# Patient Record
Sex: Male | Born: 1935 | Race: White | Hispanic: No | Marital: Married | Smoking: Never smoker
Health system: Southern US, Community
[De-identification: ages and names within clinical notes are randomized; demographics above are authoritative.]

## PROBLEM LIST (undated history)

## (undated) DIAGNOSIS — N400 Enlarged prostate without lower urinary tract symptoms: Secondary | ICD-10-CM

## (undated) DIAGNOSIS — M1990 Unspecified osteoarthritis, unspecified site: Secondary | ICD-10-CM

## (undated) DIAGNOSIS — R339 Retention of urine, unspecified: Secondary | ICD-10-CM

## (undated) DIAGNOSIS — R319 Hematuria, unspecified: Secondary | ICD-10-CM

## (undated) DIAGNOSIS — Z860101 Personal history of adenomatous and serrated colon polyps: Secondary | ICD-10-CM

## (undated) DIAGNOSIS — Z960 Presence of urogenital implants: Secondary | ICD-10-CM

## (undated) DIAGNOSIS — K5730 Diverticulosis of large intestine without perforation or abscess without bleeding: Secondary | ICD-10-CM

## (undated) DIAGNOSIS — Z978 Presence of other specified devices: Secondary | ICD-10-CM

## (undated) DIAGNOSIS — Z973 Presence of spectacles and contact lenses: Secondary | ICD-10-CM

## (undated) DIAGNOSIS — Z86010 Personal history of colonic polyps: Secondary | ICD-10-CM

## (undated) HISTORY — PX: TONSILLECTOMY AND ADENOIDECTOMY: SUR1326

## (undated) HISTORY — PX: COLONOSCOPY WITH PROPOFOL: SHX5780

---

## 2002-06-16 ENCOUNTER — Observation Stay (HOSPITAL_COMMUNITY): Admission: RE | Admit: 2002-06-16 | Discharge: 2002-06-17 | Payer: Self-pay | Admitting: Orthopedic Surgery

## 2002-06-16 ENCOUNTER — Encounter: Payer: Self-pay | Admitting: Orthopedic Surgery

## 2002-06-16 HISTORY — PX: SHOULDER ARTHROSCOPY WITH SUBACROMIAL DECOMPRESSION: SHX5684

## 2003-08-03 ENCOUNTER — Encounter: Payer: Self-pay | Admitting: Internal Medicine

## 2006-09-09 ENCOUNTER — Ambulatory Visit: Payer: Self-pay | Admitting: Internal Medicine

## 2006-09-12 ENCOUNTER — Encounter: Admission: RE | Admit: 2006-09-12 | Discharge: 2006-09-12 | Payer: Self-pay | Admitting: Orthopedic Surgery

## 2006-09-24 ENCOUNTER — Ambulatory Visit: Payer: Self-pay | Admitting: Internal Medicine

## 2006-11-05 ENCOUNTER — Ambulatory Visit (HOSPITAL_COMMUNITY): Admission: RE | Admit: 2006-11-05 | Discharge: 2006-11-06 | Payer: Self-pay | Admitting: Orthopedic Surgery

## 2006-11-05 HISTORY — PX: SHOULDER ARTHROSCOPY W/ SUBACROMIAL DECOMPRESSION AND DISTAL CLAVICLE EXCISION: SHX2401

## 2007-02-04 ENCOUNTER — Encounter: Admission: RE | Admit: 2007-02-04 | Discharge: 2007-02-04 | Payer: Self-pay | Admitting: Orthopedic Surgery

## 2010-06-12 ENCOUNTER — Encounter (INDEPENDENT_AMBULATORY_CARE_PROVIDER_SITE_OTHER): Payer: Self-pay | Admitting: *Deleted

## 2010-06-12 ENCOUNTER — Telehealth: Payer: Self-pay | Admitting: Internal Medicine

## 2010-06-24 ENCOUNTER — Ambulatory Visit: Payer: Self-pay | Admitting: Internal Medicine

## 2010-06-24 DIAGNOSIS — K625 Hemorrhage of anus and rectum: Secondary | ICD-10-CM

## 2010-06-24 DIAGNOSIS — Z860100 Personal history of colon polyps, unspecified: Secondary | ICD-10-CM | POA: Insufficient documentation

## 2010-06-25 ENCOUNTER — Ambulatory Visit: Payer: Self-pay | Admitting: Internal Medicine

## 2010-06-28 ENCOUNTER — Encounter: Payer: Self-pay | Admitting: Internal Medicine

## 2010-09-24 NOTE — Letter (Signed)
Summary: New Patient letter  [facility]  [address] [place], [place] [address]   Phone: [phone]  Fax: [phone]       06/12/2010 MRN: [id number]  [facility] [address] [place], [place]  [address]  Dear Cody Hudson,  Welcome to the Gastroenterology Division at [facility].    You are scheduled to see Dr.  Marina Goodell  on10/31/11 at 2:45 pm on the 3rd floor at [facility], [address]  We ask that you try to arrive at our office 15 minutes prior to your appointment time to allow for check-in.  We would like you to complete the enclosed self-administered evaluation form prior to your visit and bring it with you on the day of your appointment.  We will review it with you.  Also, please bring a complete list of all your medications or, if you prefer, bring the medication bottles and we will list them.  Please bring your insurance card so that we may make a copy of it.  If your insurance requires a referral to see a specialist, please bring your referral form from your primary care physician.  Co-payments are due at the time of your visit and may be paid by cash, check or credit card.     Your office visit will consist of a consult with your physician (includes a physical exam), any laboratory testing he/she may order, scheduling of any necessary diagnostic testing (e.g. x-ray, ultrasound, CT-scan), and scheduling of a procedure (e.g. Endoscopy, Colonoscopy) if required.  Please allow enough time on your schedule to allow for any/all of these possibilities.    If you cannot keep your appointment, please call [phone] to cancel or reschedule prior to your appointment date.  This allows [place] the opportunity to schedule an appointment for another patient in need of care.  If you do not cancel or reschedule by 5 p.m. the business day prior to your appointment date, you will be charged a $50.00 late cancellation/no-show fee.    Thank you for choosing  [facility] for your medical needs.  We appreciate the opportunity to care for you.  Please visit [place] at our website  to learn more about our practice.                     Sincerely,                                                             The Gastroenterology Division

## 2010-09-24 NOTE — Procedures (Signed)
Summary: Colonoscopy   Colonoscopy  Procedure date:  08/03/2003  Findings:      Location:  [facility].  Results: Diverticulosis.       Pathology:  Adenomatous polyp.          Procedures Next Due Date:    Colonoscopy: 07/2006  Colonoscopy  Procedure date:  08/03/2003  Findings:      Location:  [facility].  Results: Diverticulosis.       Pathology:  Adenomatous polyp.          Procedures Next Due Date:    Colonoscopy: 07/2006 Patient Name: Cody Hudson, Cody Hudson. MRN:  Procedure Procedures: Colonoscopy CPT: [id number].    with polypectomy. CPT: [id number].  Personnel: Endoscopist: Wilhemina Bonito. Marina Goodell, MD.  Referred By: Pearletha Furl Jacky Kindle, MD.  Exam Location: Exam performed in Outpatient Clinic. Outpatient  Patient Consent: Procedure, Alternatives, Risks and Benefits discussed, consent obtained, from patient. Consent was obtained by the RN.  Indications  Average Risk Screening Routine.  History  Current Medications: Patient is not currently taking Coumadin.  Pre-Exam Physical: Performed Aug 03, 2003. Entire physical exam was normal.  Exam Exam: Extent of exam reached: Cecum, extent intended: Cecum.  The cecum was identified by appendiceal orifice and IC valve. Patient position: on left side. Colon retroflexion performed. Images taken. ASA Classification: II. Tolerance: excellent.  Monitoring: Pulse and BP monitoring, Oximetry used. Supplemental O2 given.  Colon Prep Used Golytely for colon prep. Prep results: excellent.  Sedation Meds: Patient assessed and found to be appropriate for moderate (conscious) sedation. Fentanyl 100 mcg. given IV. Versed 10 mg. given IV.  Findings NORMAL EXAM: Cecum to Rectum. Comments: INTERNAL HEMORRHOIDS PRESENT.  POLYP: Cecum, diminutive, Procedure:  snare without cautery, removed, retrieved, Polyp sent to pathology. ICD9: Colon Polyps: 211.3.  - DIVERTICULOSIS: Ascending Colon to Sigmoid Colon.  ICD9: Diverticulosis, Colon: 562.10. Comments: MARKED CHANGES IN LEFT COLON.   Assessment Abnormal examination, see findings above.  Diagnoses: 211.3: Colon Polyps.  562.10: Diverticulosis, Colon.  455.0: Hemorrhoids, Internal.   Events  Unplanned Interventions: No intervention was required.  Unplanned Events: There were no complications. Plans Disposition: After procedure patient sent to recovery. After recovery patient sent home.  Scheduling/Referral: Colonoscopy, to Wilhemina Bonito. Marina Goodell, MD, IN 3 YEARS IF POLYP ADENOMATOUS; OTHERWISE 5 YEARS,    This report was created from the original endoscopy report, which was reviewed and signed by the above listed endoscopist.   cc:  Geoffry Paradise, MD      The Patient

## 2010-09-24 NOTE — Procedures (Signed)
Summary: Colonoscopy   Colonoscopy  Procedure date:  09/24/2006  Findings:      Location:  [facility].  Results: Hemorrhoids.     Results: Diverticulosis.         Procedures Next Due Date:    Colonoscopy: 09/2011  Colonoscopy  Procedure date:  09/24/2006  Findings:      Location:  [facility].  Results: Hemorrhoids.     Results: Diverticulosis.         Procedures Next Due Date:    Colonoscopy: 09/2011 Patient Name: Fumio, Vandam. MRN:  Procedure Procedures: Colonoscopy CPT: [phone].  Personnel: Endoscopist: Wilhemina Bonito. Marina Goodell, MD.  Exam Location: Exam performed in Outpatient Clinic. Outpatient  Patient Consent: Procedure, Alternatives, Risks and Benefits discussed, consent obtained, from patient. Consent was obtained by the RN.  Indications  Surveillance of: Adenomatous Polyp(s). This is an initial surveillance exam. Initial polypectomy was performed in 2004. in Dec. 1-2 Polyps were found at Index Exam. Largest polyp removed was 1 to 5 mm. Prior polyp located in distal colon. Pathology of worst  polyp: tubular adenoma.  History  Current Medications: Patient is not currently taking Coumadin.  Pre-Exam Physical: Performed Sep 24, 2006. Cardio-pulmonary exam, Rectal exam, HEENT exam , Abdominal exam, Mental status exam WNL.  Comments: Pt. history reviewed/updated, physical exam performed prior to initiation of sedation?yes Exam Exam: Extent of exam reached: Cecum, extent intended: Cecum.  The cecum was identified by appendiceal orifice and IC valve. Patient position: on left side. Time to Cecum: 00:04:23. Time for Withdrawl: 00:10:55. Colon retroflexion performed. Images taken. ASA Classification: II. Tolerance: excellent.  Monitoring: Pulse and BP monitoring, Oximetry used. Supplemental O2 given.  Colon Prep Used Miralax for colon prep. Prep results: excellent.  Sedation Meds: Patient assessed and found to be appropriate for  moderate (conscious) sedation. Fentanyl 75 mcg. given IV. Versed 8 mg. given IV.  Findings NORMAL EXAM: Cecum to Rectum.  - DIVERTICULOSIS: Descending Colon to Sigmoid Colon. ICD9: Diverticulosis, Colon: 562.10. Comments: moderate changes / deep folds.   Assessment  Diagnoses: 562.10: Diverticulosis, Colon.  455.0: Hemorrhoids, Internal.   Comments: NO POLYPS SEEN Events  Unplanned Interventions: No intervention was required.  Unplanned Events: There were no complications. Plans Disposition: After procedure patient sent to recovery. After recovery patient sent home.  Scheduling/Referral: Colonoscopy, to Wilhemina Bonito. Marina Goodell, MD, in 5 years,    This report was created from the original endoscopy report, which was reviewed and signed by the above listed endoscopist.   cc:  Maretta Bees      The Patient

## 2010-09-24 NOTE — Progress Notes (Signed)
Summary: triage  Phone Note From Other Clinic Call back at [phone]   Caller: Malachi Bonds, scheduler Call For: Dr. Marina Goodell Reason for Call: Schedule Patient Appt Summary of Call: Dr. Jacky Kindle would like pt seen for rectal bleeding... Malachi Bonds said next available is not soon enough... asked that pt isd called directly at [phone] Initial call taken by: Vallarie Mare,  June 12, 2010 2:40 PM  Follow-up for Phone Call        pt scheduled for 06/24/10 Malachi Bonds to send records Follow-up by: Chales Abrahams CMA Duncan Dull),  June 12, 2010 3:01 PM

## 2010-09-24 NOTE — Procedures (Signed)
Summary: Colonoscopy  Patient: Cody Hudson Note: All result statuses are Final unless otherwise noted.  Tests: (1) Colonoscopy (COL)   COL Colonoscopy           DONE     [facility]     [address]     [place], [place]  [address]           COLONOSCOPY PROCEDURE REPORT           PATIENT:  [name], [name]  MR#:  [id number]     BIRTHDATE:  [birth year], 76 yrs. old  GENDER:  male     ENDOSCOPIST:  Wilhemina Bonito. Eda Keys, MD     REF. BY:  Office     PROCEDURE DATE:  06/25/2010     PROCEDURE:  Colonoscopy with snare polypectomy X 1     ASA CLASS:  Class II     INDICATIONS:  history of pre-cancerous (adenomatous) colon polyps,     surveillance and high-risk screening, rectal bleeding ; INDEX EXAM     07-2003 W/ TA; F/U 08-2006     MEDICATIONS:   Fentanyl 75 mcg IV, Versed 7 mg IV           DESCRIPTION OF PROCEDURE:   After the risks benefits and     alternatives of the procedure were thoroughly explained, informed     consent was obtained.  Digital rectal exam was performed and     revealed no abnormalities.   The LB 180AL [id number] endoscope was     introduced through the anus and advanced to the cecum, which was     identified by both the appendix and ileocecal valve, without     limitations.TIME TO CECUM = 3:28 MIN. The quality of the prep was     excellent, using MoviPrep.  The instrument was then slowly     withdrawn (TIME = 10:37 MIN) as the colon was fully examined.     <<PROCEDUREIMAGES>>           FINDINGS:  A diminutive polyp was found in the ascending colon.     Polyp was snared without cautery. Retrieval was successful.     Moderate diverticulosis was found in the left colon.   Retroflexed     views in the rectum revealed internal hemorrhoids.    The scope     was then withdrawn from the patient and the procedure completed.           COMPLICATIONS:  None     ENDOSCOPIC IMPRESSION:     1) Diminutive polyp in the ascending colon - REMOVED     2) Moderate  diverticulosis in the left colon     3) Internal hemorrhoids - CAUSE FOR RECENT BLEEDING           RECOMMENDATIONS:     1) Follow up colonoscopy in 5 years           ______________________________     Wilhemina Bonito. Eda Keys, MD           CC:  Geoffry Paradise, MD; The Patient           n.     eSIGNED:   Wilhemina Bonito. Eda Keys at 06/25/2010 02:59 PM           Loistine Simas, [id number]  Note: An exclamation mark (!) indicates a result that was not dispersed into the flowsheet. Document Creation Date: 06/25/2010 2:59 PM _______________________________________________________________________  (1) Order result status: Final Collection  or observation date-time: 06/25/2010 14:53 Requested date-time:  Receipt date-time:  Reported date-time:  Referring Physician:   Ordering Physician: Fransico Setters [phone]) Specimen Source:  Source: Launa Grill Order Number: [id number] Lab site:   Appended Document: Colonoscopy     Procedures Next Due Date:    Colonoscopy: 06/2015

## 2010-09-24 NOTE — Letter (Signed)
Summary: Patient Notice- Polyp Results  [facility]  [address] [place], [place] [address]   Phone: [phone]  Fax: [phone]        June 28, 2010 MRN: [id number]    [facility] [address] [place], [place]  [address]    Dear Cody Hudson,  I am pleased to inform you that the colon polyp(s) removed during your recent colonoscopy was (were) found to be benign (no cancer detected) upon pathologic examination.  I recommend you have a repeat colonoscopy examination in 5 years to look for recurrent polyps, as having colon polyps increases your risk for having recurrent polyps or even colon cancer in the future.  Should you develop new or worsening symptoms of abdominal pain, bowel habit changes or bleeding from the rectum or bowels, please schedule an evaluation with either your primary care physician or with me.  Additional information/recommendations:  __ No further action with gastroenterology is needed at this time. Please      follow-up with your primary care physician for your other healthcare      needs.    Please call us if you are having persistent problems or have questions about your condition that have not been fully answered at this time.  Sincerely,  Hilarie Fredrickson MD  This letter has been electronically signed by your physician.  Appended Document: Patient Notice- Polyp Results letter mailed

## 2010-09-24 NOTE — Letter (Signed)
Summary: [facility] Instructions  [facility]  [address] [place], [place] [address]   Phone: [phone]  Fax: [phone]       ESTEVON FLUKE    Jul 24, 1948    MRN: [id number]        Procedure Day /Date:TUESDAY, 06/25/10     Arrival Time:1:00 PM     Procedure Time:2:00 PM     Location of Procedure:                    X  [facility] (4th Floor)  PREPARATION FOR COLONOSCOPY WITH MOVIPREP   Starting TODAY no solid foods.  THE DAY BEFORE YOUR PROCEDURE         DATE:06/24/10 DAY: today  1.  Drink clear liquids the entire day-NO SOLID FOOD  2.  Do not drink anything colored red or purple.  Avoid juices with pulp.  No orange juice.  3.  Drink at least 64 oz. (8 glasses) of fluid/clear liquids during the day to prevent dehydration and help the prep work efficiently.  CLEAR LIQUIDS INCLUDE: Water Jello Ice Popsicles Tea (sugar ok, no milk/cream) Powdered fruit flavored drinks Coffee (sugar ok, no milk/cream) Gatorade Juice: apple, white grape, white cranberry  Lemonade Clear bullion, consomm, broth Carbonated beverages (any kind) Strained chicken noodle soup Hard Candy                             4.  In the morning, mix first dose of MoviPrep solution:    Empty 1 Pouch A and 1 Pouch B into the disposable container    Add lukewarm drinking water to the top line of the container. Mix to dissolve    Refrigerate (mixed solution should be used within 24 hrs)  5.  Begin drinking the prep at 5:00 p.m. The MoviPrep container is divided by 4 marks.   Every 15 minutes drink the solution down to the next mark (approximately 8 oz) until the full liter is complete.   6.  Follow completed prep with 16 oz of clear liquid of your choice (Nothing red or purple).  Continue to drink clear liquids until bedtime.  7.  Before going to bed, mix second dose of MoviPrep solution:    Empty 1 Pouch A and 1 Pouch B into the disposable container    Add  lukewarm drinking water to the top line of the container. Mix to dissolve    Refrigerate  THE DAY OF YOUR PROCEDURE      DATE: 06/25/10 DAY: TUESDAY  Beginning at 9:00 a.m. (5 hours before procedure):         1. Every 15 minutes, drink the solution down to the next mark (approx 8 oz) until the full liter is complete.  2. Follow completed prep with 16 oz. of clear liquid of your choice.    3. You may drink clear liquids until 12 NOON (2 HOURS BEFORE PROCEDURE).   MEDICATION INSTRUCTIONS  Unless otherwise instructed, you should take regular prescription medications with a small sip of water   as early as possible the morning of your procedure.         OTHER INSTRUCTIONS  You will need a responsible adult at least 75 years of age to accompany you and drive you home.   This person must remain in the waiting room during your procedure.  Wear loose fitting clothing that is easily removed.  Leave jewelry and  other valuables at home.  However, you may wish to bring a book to read or  an iPod/MP3 player to listen to music as you wait for your procedure to start.  Remove all body piercing jewelry and leave at home.  Total time from sign-in until discharge is approximately 2-3 hours.  You should go home directly after your procedure and rest.  You can resume normal activities the  day after your procedure.  The day of your procedure you should not:   Drive   Make legal decisions   Operate machinery   Drink alcohol   Return to work  You will receive specific instructions about eating, activities and medications before you leave.    The above instructions have been reviewed and explained to me by   _______________________    I fully understand and can verbalize these instructions _____________________________ Date _________

## 2010-09-24 NOTE — Assessment & Plan Note (Signed)
Summary: Rectal bleeding (personal history adenoma, family history CRC)   History of Present Illness Visit Type: Initial Consult Primary GI MD: Yancey Flemings MD Primary Provider: Geoffry Paradise, MD Requesting Provider: Geoffry Paradise, MD Chief Complaint: Rectal bleeding 3 days, large amount of blood past plus clots, family hx of colon cancer History of Present Illness:   77 year old with a history of adenomatous polyps and diverticulosis who presents today regarding rectal bleeding. Patient describes some problems with rectal bleeding about 2 weeks ago. He describes some blood associated with the stool 2 consecutive days. Following evening he noticed some blood on the floor with clots. No bleeding since. He denies abdominal pain, or weight loss. He did have a transient change in bowel habits with constipation preceding this episode. He was using laxatives. Now describes his bowels is back to normal or regular. His index colonoscopy in 2004 revealed adenomatous polyps. Followup in January 2008 was negative for recurrent neoplasia. Routine followup in 5 years recommended. No other issues or complaints.   GI Review of Systems    Reports abdominal pain.      Denies acid reflux, belching, bloating, chest pain, dysphagia with liquids, dysphagia with solids, heartburn, loss of appetite, nausea, vomiting, vomiting blood, weight loss, and  weight gain.      Reports change in bowel habits, constipation, hemorrhoids, rectal bleeding, and  rectal pain.     Denies anal fissure, black tarry stools, diarrhea, diverticulosis, fecal incontinence, heme positive stool, irritable bowel syndrome, jaundice, light color stool, and  liver problems. Preventive Screening-Counseling & Management  Alcohol-Tobacco     Smoking Status: never      Drug Use:  no.      Current Medications (verified): 1)  Doxazosin Mesylate 4 Mg Tabs (Doxazosin Mesylate) .... Take 1 Tablet By Mouth Two Times A Day  Allergies  (verified): No Known Drug Allergies  Past History:  Past Medical History: Reviewed history from 06/21/2010 and no changes required. Adenomatous Colon Polyps Diverticulosis Hemorrhoids Family Hx. of Colon Cancer  Past Surgical History: Reviewed history from 06/21/2010 and no changes required. Tonsillectomy Shoulder Surgery  Family History: Family History of Colon Cancer:Father  Social History: Occupation: Retired Patient has never smoked.  Alcohol Use - yes Illicit Drug Use - no Smoking Status:  never Drug Use:  no  Review of Systems       The patient complains of sleeping problems.  The patient denies allergy/sinus, anemia, anxiety-new, arthritis/joint pain, back pain, blood in urine, breast changes/lumps, change in vision, confusion, cough, coughing up blood, depression-new, fainting, fatigue, fever, headaches-new, hearing problems, heart murmur, heart rhythm changes, itching, menstrual pain, muscle pains/cramps, night sweats, nosebleeds, pregnancy symptoms, shortness of breath, skin rash, sore throat, swelling of feet/legs, swollen lymph glands, thirst - excessive , urination - excessive , urination changes/pain, urine leakage, vision changes, and voice change.    Vital Signs:  Patient profile:   77 year old male Height:      75 inches Weight:      226.25 pounds BMI:     28.38 Pulse rate:   72 / minute Pulse rhythm:   regular BP sitting:   114 / 70  (left arm) Cuff size:   regular  Vitals Entered By: June McMurray CMA Duncan Dull) (June 24, 2010 2:50 PM)  Physical Exam  General:  Well developed, well nourished, no acute distress. Head:  Normocephalic and atraumatic. Eyes:  PERRLA, no icterus. Nose:  No deformity, discharge,  or lesions. Mouth:  No deformity or lesions.  Neck:  Supple; no masses or thyromegaly. Lungs:  Clear throughout to auscultation. Heart:  Regular rate and rhythm; no murmurs, rubs,  or bruits. Abdomen:  Soft, nontender and nondistended. No  masses, hepatosplenomegaly or hernias noted. Normal bowel sounds. Rectal:  deferred until colonoscopy Msk:  Symmetrical with no gross deformities. Normal posture. Pulses:  Normal pulses noted. Extremities:  No clubbing, cyanosis, edema or deformities noted. Neurologic:  Alert and  oriented x4. Skin:  Intact without significant lesions or rashes. Psych:  Alert and cooperative. Normal mood and affect.   Impression & Recommendations:  Problem # 1:  RECTAL BLEEDING (ICD-569.3) recent problems with rectal bleeding as described. This after some problems with constipation. Suspect secondary to internal hemorrhoids. Does not sound like diverticular bleed. Rule out recurrent neoplasia.  Plan: #1. Colonoscopy. The nature of the procedure as well as the risks, benefits, and alternatives were reviewed. He understood and agreed to proceed. #2. Movi prep prescribed. The patient instructed on its use  Problem # 2:  COLONIC POLYPS, HX OF (ICD-V12.72) history of adenomatous polyps in 2004. Negative for polyps January 2008. We will move his surveillance examination to this time.  Problem # 3:  FAMILY HX COLON CANCER (ICD-V16.0) in his father  Other Orders: Colonoscopy (Colon)  Patient Instructions: 1)  Colonoscopy [facility] 06/25/10 2:00 pm arrive at 1:00 pm 2)  Movi prep instructions given to patient. 3)  Movi prep Rx. sent to pharmacy. 4)  Copy sent to : Geoffry Paradise, MD 5)  Colonoscopy and Flexible Sigmoidoscopy brochure given.  6)  The medication list was reviewed and reconciled.  All changed / newly prescribed medications were explained.  A complete medication list was provided to the patient / caregiver.

## 2011-01-10 NOTE — Consult Note (Signed)
NAME:  Cody Hudson, Cody Hudson                         ACCOUNT NO.:  [id number]   MEDICAL RECORD NO.:  [id number]                   PATIENT TYPE:  OBV   LOCATION:  0467                                 FACILITY:  [facility]   PHYSICIAN:  Mark C. Vernie Ammons, M.D.               DATE OF BIRTH:  01/04/1934   DATE OF CONSULTATION:  06/17/2002  DATE OF DISCHARGE:                                   CONSULTATION   HISTORY:  The patient is a 77 year old white male who came in for an  elective right shoulder arthroscopy.  He had some mild obstructive symptoms  prior to his admission for about two years consisting of some urgency,  nocturia x2, decreased force of stream, and some intermittency that has been  occurring for about the past three months.  He denied any hesitancy.  He has  never had any gross hematuria, never had history of kidney stones, prior  UTI, prostatitis, or other urologic complaint.  After having his shoulder  surgery, he became full, had strong urge but was unable to void and was in-  and-out catheter 400 cc.  After a second failed attempt at voiding and  draining 500 cc, the Foley catheter was left in.  His urine has been bloody  since the second catheterization.  He currently has a Foley catheter  draining his bladder and had been placed on some Cardura by Dr. Jacky Kindle for  some mild obstructive symptoms a couple of months ago.   PAST MEDICAL HISTORY:  Positive for some left shoulder problems.  He denies  hypertension.  He is otherwise in good health.   PAST SURGICAL HISTORY:  He has had a tonsillectomy in the past.   MEDICATIONS ON ADMISSION:  1. Hydrocodone as needed as night.  2. Doxazosin, dosage is unknown.   ALLERGIES:  NKDA.   SOCIAL HISTORY:  No tobacco, occasional wine use.   FAMILY HISTORY:  Negative for GU malignancy or renal disease.   REVIEW OF SYSTEMS:  Urologically as noted above.  Otherwise per section 2 of  the health history in his chart which is reviewed and  signed.   PHYSICAL EXAMINATION:  GENERAL:  The patient is a well-developed, well-  nourished white male.  In no apparent distress.  VITAL SIGNS:  Temperature 98.2, blood pressure 110/64, respirations 18,  pulse 54.  His weight is 205.  ABDOMEN:  Soft and nontender without mass or HSM.  He had no inguinal  hernias or adenopathy.  GENITOURINARY:  He has a normal circumcised phallus without lesions or  discharge with a Foley catheter indwelling draining blood-tinged urine with  no clots.  His scrotum is normal, testicles are descended and equal in size  and consistency without tenderness or nodularity, no induration.  Epididymides are normal, penis normal.  RECTAL:  Anus and perineum with normal rectal tone.  His prostate is smooth  and symmetric, 1+ enlarged.  He has no suspicious nodularity or induration.  No seminal vesicle abnormality present.   LABORATORY DATA:  BUN 20, creatinine 1.2.    IMPRESSION:  1. Benign prostatic hypertrophy by exam.  2. Bladder outlet obstruction.  This is noted prior to his hospitalization.     I think it has contributed to his acute urinary retention.  3. Acute urinary retention after surgery likely due to the fact that he had     prior outlet obstructive symptoms combined with pain medication and the     lingering effects of the anesthesia.  This should resolve with time.  I     am going to continue Foley catheter drainage for a little while     especially until his urine clears.  4. Gross hematuria.  He has never had this previously, it only occurred     after his Foley catheter was placed.  Once his catheter is out, I will     recheck his urine to make sure it clears but I do not think hematuria     workup is indicated at this point.   PLAN:  1. He is going to continue his Cardura and I have added to that Flomax 0.4     mg two pills q.h.s.  2. His catheter will remain indwelling for two more days until Monday     morning at which time he will  take it out at home if his urine is clear.  3. He will contact me if there is any difficulty in the interim.  Otherwise,     I will plan to see him in the office for a recheck in one week.                                               Mark C. Vernie Ammons, M.D.    MCO/MEDQ  D:  06/17/2002  T:  06/17/2002  Job:  [id number]   cc:   Marlowe Kays, MD  [address]  [place]  [place] [address]  Fax: [phone]

## 2011-01-10 NOTE — Op Note (Signed)
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   OPERATION:  1. Right shoulder arthroscopy (normal examination).  2. Arthroscopic subacromial decompression.   SURGEON:  Marlowe Kays, MD   ASSISTANT:  Clarene Reamer, P.A.-C.   ANESTHESIA:  General.   PATHOLOGY AND JUSTIFICATION FOR PROCEDURE:  Chronic persistent pain, right  shoulder with impingement type symptoms refractory to nonsurgical treatment.  MRI demonstrated type 2 acromion with tendinosis but no frank tear. The  labrum appeared to be normal. All these findings were confirmed at surgery.   DESCRIPTION OF PROCEDURE:  Satisfactory general anesthesia, semi sitting  position on Schlein frame. The right shoulder girdle was prepped with  Duraprep, draped in a sterile field.  The anatomy of the shoulder was marked  out. The posterior soft spot, lateral portal and subacromial space injected  with 0.5% Marcaine and with adrenaline. Through a posterior soft spot, I  atraumatically entered the shoulder joint and on inspection it appeared to  be normal. Representative pictures were taken. I then redirected the scope  in the subacromial area and through a lateral portal introduced a blunt  cannula followed by the 4.2 shaver. He had a very active bursitis and I  cleaned out a lot of bursal tissue. There was a good bit of chronic  irritation of the rotator cuff and bleeders were subsequently cauterized  with  the ArthroCare vaporizer which I then introduced. The soft tissue was  removed with it from the subacromial area back including __________ [facility] joint  and the coracoacromial ligament was also sectioned. I then used a 4-0 oval  bur and began burring down in the surface of the acromion. Preburring  pictures as well as final films were taken. I alternated back and forth  between the bur, the 4.2 shaver and the vaporizer until we had thoroughly  decompressed the subacromial space as evidence by pictures with the arm to  the side and arm abducted. There was no unusual bleeding. The subacromial  space was then milked of all fluid possible and I infiltrated the two  portals and subacromial space once again with 0.5% Marcaine with adrenaline.  The two portals were closed with 4-0 nylon. Betadine Adaptic dry sterile  dressing and a shoulder immobilizer applied and at the time of this  dictation he was on his way to the recovery room in  satisfactory condition  with no known complications.                                                Marlowe Kays, MD   JA/MEDQ  D:  06/16/2002  T:  06/16/2002  Job:  [id number]

## 2011-01-10 NOTE — Op Note (Signed)
Cody Hudson, Cody Hudson               ACCOUNT NO.:  [id number]   MEDICAL RECORD NO.:  [id number]          PATIENT TYPE:  AMB   LOCATION:  DAY                          FACILITY:  [facility]   PHYSICIAN:  Marlowe Kays, M.D.  DATE OF BIRTH:  1934-07-09   DATE OF PROCEDURE:  11/05/2006  DATE OF DISCHARGE:                               OPERATIVE REPORT   PREOPERATIVE DIAGNOSIS:  1. Osteoarthritis of the acromioclavicular joint, left shoulder.  2. Chronic impingement syndrome with rotator cuff tendinopathy, left      shoulder.   POSTOPERATIVE DIAGNOSIS:  1. Osteoarthritis of the acromioclavicular joint, left shoulder.  2. Chronic impingement syndrome with rotator cuff tendinopathy, left      shoulder.   OPERATION:  1. Left shoulder arthroscopy with essentially normal glenohumeral      examination.  2. Arthroscopic subacromial decompression.  3. Arthroscopic decompression inferior distal clavicle.   SURGEON:  Marlowe Kays, M.D.   ASSISTANTDruscilla Brownie. Underwood, P.A.-C.   ANESTHESIA:  General.   JUSTIFICATION OF PROCEDURE:  He has had successful arthroscopic  treatment, basically the same procedure, performed on the right shoulder  by me.  He has had progressive pain in the left shoulder and some fairly  significant inferior AC joint spurring, but he is nontender at the [facility]  joint.  An MRI has demonstrated rotator cuff tendinopathy with what was  described as a bulky arthritis at the [facility] joint and could clearly be seen  on the MRI with the inferior spurring impinging into the rotator cuff.  Consequently, he is here for the above mentioned surgical procedure.   PROCEDURE:  After satisfactory general anesthesia, he was placed in the  beach chair position on the [place] frame.  His left shoulder girdle was  prepped with DuraPrep and draped in a sterile field.  The anatomy of the  shoulder joint was marked out and the subacromial space lateral and  posterior portal sites infiltrated with  0.5% Marcaine with adrenalin.  Through a posterior soft spot portal, I atraumatically entered the  glenohumeral joint.  It basically was normal on inspection with minimal  fraying of the labrum adjacent to the biceps attachment.  It was not  felt that there was enough abnormality to warrant an anterior portal.  I  then redirected the scope into the subacromial space and through a  lateral portal introduced the blunt trocar followed by a 4.2 shaver.  He  had fairly flagrant bursitis and I resected a good bit of soft tissue  and then introduced an ArthroCare 90 degrees vaporizer. He had  significant impingement of the anterior acromion which was first visible  and I soft tissue from the under surface of it.  I then gradually worked  my way medially and was able to identify the [facility] joint and distal  clavicle and again, the pathology of impingement noted on the MRI was  confirmed.  I removed soft tissue from around the under surface of the  distal clavicle, as well, with the vaporizer.  I then brought in the 4  mm oval bur and began burring down  the under surface of the acromion and  distal clavicle working back and forth with it and the vaporizer. His  impingement was so tight that Mr. Idolina Primer had to pull down the arm for  a good bit of the procedure.  Bleeders were coagulated as we worked.  At  the conclusion of the case, under direct visualization, he had wide  decompression of the under surface of the acromion and of the under  surface of the distal clavicle.  All fluid possible was then removed  from the subacromial space which was infiltrated along with the two  portals, once again, with 0.5% Marcaine with adrenaline.  The two  portals were closed with 4-0 nylon followed by Betadine, Adaptic, dry  sterile dressing, and shoulder immobilizer.  He tolerated the procedure  well and at the time of this dictation is on his way to the recovery  room in satisfactory condition with no known  complications.           ______________________________  Marlowe Kays, M.D.     JA/MEDQ  D:  11/05/2006  T:  11/06/2006  Job:  [id number]

## 2015-03-28 ENCOUNTER — Encounter: Payer: Self-pay | Admitting: Internal Medicine

## 2015-07-09 ENCOUNTER — Encounter: Payer: Self-pay | Admitting: Internal Medicine

## 2015-08-28 ENCOUNTER — Ambulatory Visit (AMBULATORY_SURGERY_CENTER): Payer: Self-pay

## 2015-08-28 VITALS — Ht 73.0 in | Wt 222.6 lb

## 2015-08-28 DIAGNOSIS — Z860100 Personal history of colon polyps, unspecified: Secondary | ICD-10-CM

## 2015-08-28 MED ORDER — SUPREP BOWEL PREP KIT 17.5-3.13-1.6 GM/177ML PO SOLN: 1.0000 | Freq: Once | ORAL | Status: DC

## 2015-08-28 NOTE — Progress Notes (Signed)
No allergies to eggs or soy No diet/weight loss meds No home oxgyen No past problems with anesthesia  Has email and internet; refused emmi

## 2015-09-12 ENCOUNTER — Ambulatory Visit (AMBULATORY_SURGERY_CENTER): Payer: PPO | Admitting: Internal Medicine

## 2015-09-12 ENCOUNTER — Encounter: Payer: Self-pay | Admitting: Internal Medicine

## 2015-09-12 VITALS — BP 132/68 | HR 59 | Temp 96.6°F | Resp 19 | Ht 73.0 in | Wt 222.0 lb

## 2015-09-12 DIAGNOSIS — E669 Obesity, unspecified: Secondary | ICD-10-CM | POA: Diagnosis not present

## 2015-09-12 DIAGNOSIS — Z86010 Personal history of colonic polyps: Secondary | ICD-10-CM

## 2015-09-12 DIAGNOSIS — N400 Enlarged prostate without lower urinary tract symptoms: Secondary | ICD-10-CM | POA: Diagnosis not present

## 2015-09-12 MED ORDER — SODIUM CHLORIDE 0.9 % IV SOLN: 500.0000 mL | INTRAVENOUS | Status: DC

## 2015-09-12 NOTE — Progress Notes (Signed)
No problems noted in the recovery room. maw 

## 2015-09-12 NOTE — Progress Notes (Signed)
Report to PACU, RN, vss, BBS= Clear.  

## 2015-09-12 NOTE — Patient Instructions (Signed)
YOU HAD AN ENDOSCOPIC PROCEDURE TODAY AT THE [facility]:   Refer to the procedure report that was given to you for any specific questions about what was found during the examination.  If the procedure report does not answer your questions, please call your gastroenterologist to clarify.  If you requested that your care partner not be given the details of your procedure findings, then the procedure report has been included in a sealed envelope for you to review at your convenience later.  YOU SHOULD EXPECT: Some feelings of bloating in the abdomen. Passage of more gas than usual.  Walking can help get rid of the air that was put into your GI tract during the procedure and reduce the bloating. If you had a lower endoscopy (such as a colonoscopy or flexible sigmoidoscopy) you may notice spotting of blood in your stool or on the toilet paper. If you underwent a bowel prep for your procedure, you may not have a normal bowel movement for a few days.  Please Note:  You might notice some irritation and congestion in your nose or some drainage.  This is from the oxygen used during your procedure.  There is no need for concern and it should clear up in a day or so.  SYMPTOMS TO REPORT IMMEDIATELY:   Following lower endoscopy (colonoscopy or flexible sigmoidoscopy):  Excessive amounts of blood in the stool  Significant tenderness or worsening of abdominal pains  Swelling of the abdomen that is new, acute  Fever of 100F or higher   For urgent or emergent issues, a gastroenterologist can be reached at any hour by calling [phone].   DIET: Your first meal following the procedure should be a small meal and then it is ok to progress to your normal diet. Heavy or fried foods are harder to digest and may make you feel nauseous or bloated.  Likewise, meals heavy in dairy and vegetables can increase bloating.  Drink plenty of fluids but you should avoid alcoholic beverages for 24  hours.  ACTIVITY:  You should plan to take it easy for the rest of today and you should NOT DRIVE or use heavy machinery until tomorrow (because of the sedation medicines used during the test).    FOLLOW UP: Our staff will call the number listed on your records the next business day following your procedure to check on you and address any questions or concerns that you may have regarding the information given to you following your procedure. If we do not reach you, we will leave a message.  However, if you are feeling well and you are not experiencing any problems, there is no need to return our call.  We will assume that you have returned to your regular daily activities without incident.  If any biopsies were taken you will be contacted by phone or by letter within the next 1-3 weeks.  Please call us at [phone] if you have not heard about the biopsies in 3 weeks.    SIGNATURES/CONFIDENTIALITY: You and/or your care partner have signed paperwork which will be entered into your electronic medical record.  These signatures attest to the fact that that the information above on your After Visit Summary has been reviewed and is understood.  Full responsibility of the confidentiality of this discharge information lies with you and/or your care-partner.    Handouts were given to your care partner on hemorrhoids, diverticulosis, and a high fiber diet with liberal fluid intake. You may resume   your current medications today. Please call if any questions or concerns.   

## 2015-09-12 NOTE — Op Note (Signed)
[facility] [address] [place], [address]   COLONOSCOPY PROCEDURE REPORT  PATIENT: Quintan, Saldivar  MR#: [id number] BIRTHDATE: 31-Oct-1933 , 81  yrs. old GENDER: male ENDOSCOPIST: Roxy Cedar, MD REFERRED XB:[id number] Program Recall PROCEDURE DATE:  09/12/2015 PROCEDURE:   Colonoscopy, surveillance First Screening Colonoscopy - Avg.  risk and is 50 yrs.  old or older - No.  Prior Negative Screening - Now for repeat screening. N/A  History of Adenoma - Now for follow-up colonoscopy & has been > or = to 3 yrs.  Yes hx of adenoma.  Has been 3 or more years since last colonoscopy.  Polyps removed today? No Recommend repeat exam, <10 yrs? No ASA CLASS:   Class II INDICATIONS:Surveillance due to prior colonic neoplasia and PH Colon Adenoma.  . Index examination 2004 (tubular adenoma); 2008 (negative); 2011 (small tubular adenoma) MEDICATIONS: Monitored anesthesia care and Propofol 200 mg IV  DESCRIPTION OF PROCEDURE:   After the risks benefits and alternatives of the procedure were thoroughly explained, informed consent was obtained.  The digital rectal exam revealed no abnormalities of the rectum.   The LB [id number]  endoscope was introduced through the anus and advanced to the cecum, which was identified by both the appendix and ileocecal valve. No adverse events experienced.   The quality of the prep was excellent. (Suprep was used)  The instrument was then slowly withdrawn as the colon was fully examined. Estimated blood loss is zero unless otherwise noted in this procedure report.    COLON FINDINGS: There was moderate diverticulosis noted throughout the entire examined colon.   The examination was otherwise normal. Retroflexed views revealed internal hemorrhoids. The time to cecum = 3.0 Withdrawal time = 9.8   The scope was withdrawn and the procedure completed. COMPLICATIONS: There were no immediate complications.  ENDOSCOPIC  IMPRESSION: 1.   Moderate diverticulosis was noted throughout the entire examined colon 2.   The examination was otherwise normal  RECOMMENDATIONS:1. Return to the care of your primary provider.  GI follow up as needed   eSigned:  Roxy Cedar, MD 09/12/2015 2:57 PM   cc: Geoffry Paradise, MD and The Patient

## 2015-09-13 ENCOUNTER — Telehealth: Payer: Self-pay

## 2015-09-13 NOTE — Telephone Encounter (Signed)
  Follow up Call-  Call back number 09/12/2015  Post procedure Call Back phone  # [phone]  Permission to leave phone message Yes     Patient questions:  Do you have a fever, pain , or abdominal swelling? No. Pain Score  0 *  Have you tolerated food without any problems? Yes.    Have you been able to return to your normal activities? Yes.    Do you have any questions about your discharge instructions: Diet   No. Medications  No. Follow up visit  No.  Do you have questions or concerns about your Care? No.  Actions: * If pain score is 4 or above: No action needed, pain <4.

## 2015-10-03 DIAGNOSIS — E784 Other hyperlipidemia: Secondary | ICD-10-CM | POA: Diagnosis not present

## 2015-10-03 DIAGNOSIS — Z125 Encounter for screening for malignant neoplasm of prostate: Secondary | ICD-10-CM | POA: Diagnosis not present

## 2015-10-10 DIAGNOSIS — Z1212 Encounter for screening for malignant neoplasm of rectum: Secondary | ICD-10-CM | POA: Diagnosis not present

## 2015-12-24 DIAGNOSIS — Z0000 Encounter for general adult medical examination without abnormal findings: Secondary | ICD-10-CM | POA: Diagnosis not present

## 2015-12-24 DIAGNOSIS — M25562 Pain in left knee: Secondary | ICD-10-CM | POA: Diagnosis not present

## 2015-12-24 DIAGNOSIS — R9721 Rising PSA following treatment for malignant neoplasm of prostate: Secondary | ICD-10-CM | POA: Diagnosis not present

## 2015-12-24 DIAGNOSIS — M1990 Unspecified osteoarthritis, unspecified site: Secondary | ICD-10-CM | POA: Diagnosis not present

## 2015-12-24 DIAGNOSIS — K635 Polyp of colon: Secondary | ICD-10-CM | POA: Diagnosis not present

## 2015-12-24 DIAGNOSIS — Z1389 Encounter for screening for other disorder: Secondary | ICD-10-CM | POA: Diagnosis not present

## 2015-12-24 DIAGNOSIS — Z125 Encounter for screening for malignant neoplasm of prostate: Secondary | ICD-10-CM | POA: Diagnosis not present

## 2015-12-24 DIAGNOSIS — E784 Other hyperlipidemia: Secondary | ICD-10-CM | POA: Diagnosis not present

## 2015-12-24 DIAGNOSIS — R079 Chest pain, unspecified: Secondary | ICD-10-CM | POA: Diagnosis not present

## 2015-12-24 DIAGNOSIS — R972 Elevated prostate specific antigen [PSA]: Secondary | ICD-10-CM | POA: Diagnosis not present

## 2015-12-24 DIAGNOSIS — N62 Hypertrophy of breast: Secondary | ICD-10-CM | POA: Diagnosis not present

## 2015-12-24 DIAGNOSIS — Z6827 Body mass index (BMI) 27.0-27.9, adult: Secondary | ICD-10-CM | POA: Diagnosis not present

## 2016-02-12 DIAGNOSIS — H2513 Age-related nuclear cataract, bilateral: Secondary | ICD-10-CM | POA: Diagnosis not present

## 2016-02-12 DIAGNOSIS — H5213 Myopia, bilateral: Secondary | ICD-10-CM | POA: Diagnosis not present

## 2016-05-27 DIAGNOSIS — N401 Enlarged prostate with lower urinary tract symptoms: Secondary | ICD-10-CM | POA: Diagnosis not present

## 2016-05-27 DIAGNOSIS — N3000 Acute cystitis without hematuria: Secondary | ICD-10-CM | POA: Diagnosis not present

## 2016-05-27 DIAGNOSIS — R350 Frequency of micturition: Secondary | ICD-10-CM | POA: Diagnosis not present

## 2016-05-27 DIAGNOSIS — R351 Nocturia: Secondary | ICD-10-CM | POA: Diagnosis not present

## 2016-06-07 ENCOUNTER — Telehealth (HOSPITAL_BASED_OUTPATIENT_CLINIC_OR_DEPARTMENT_OTHER): Payer: Self-pay | Admitting: General Surgery

## 2016-06-07 ENCOUNTER — Encounter (HOSPITAL_COMMUNITY): Payer: Self-pay | Admitting: Emergency Medicine

## 2016-06-07 ENCOUNTER — Emergency Department (HOSPITAL_COMMUNITY)
Admission: EM | Admit: 2016-06-07 | Discharge: 2016-06-07 | Disposition: A | Discharge status: Home / Self Care | Payer: PPO | Attending: Emergency Medicine | Admitting: Emergency Medicine

## 2016-06-07 DIAGNOSIS — R310 Gross hematuria: Secondary | ICD-10-CM | POA: Diagnosis not present

## 2016-06-07 DIAGNOSIS — R339 Retention of urine, unspecified: Secondary | ICD-10-CM | POA: Diagnosis not present

## 2016-06-07 LAB — URINALYSIS, ROUTINE W REFLEX MICROSCOPIC
Bilirubin Urine: NEGATIVE
GLUCOSE, UA: NEGATIVE mg/dL
Ketones, ur: NEGATIVE mg/dL
LEUKOCYTES UA: NEGATIVE
Nitrite: NEGATIVE
PH: 5.5 (ref 5.0–8.0)
Protein, ur: NEGATIVE mg/dL
SPECIFIC GRAVITY, URINE: 1.009 (ref 1.005–1.030)

## 2016-06-07 LAB — URINE MICROSCOPIC-ADD ON
Bacteria, UA: NONE SEEN
Squamous Epithelial / LPF: NONE SEEN

## 2016-06-07 NOTE — ED Provider Notes (Signed)
[facility]-EMERGENCY DEPT Provider Note   CSN: [id number] Arrival date & time: 06/07/16  1504     History   Chief Complaint Chief Complaint  Patient presents with  . Urinary Retention    HPI Cody Hudson is a 82 y.o. male.  Pt presents to the ED today with urinary retention.  He said that he has not urinated since 1000 this morning.  He has had this problem in the past, but it was after a surgery and a long time ago.  The pt does have a hx of BPH.      History reviewed. No pertinent past medical history.  Patient Active Problem List   Diagnosis Date Noted  . RECTAL BLEEDING 06/24/2010  . COLONIC POLYPS, HX OF 06/24/2010    Past Surgical History:  Procedure Laterality Date  . SHOULDER ARTHROSCOPY     bilat; bone spurs  . TONSILLECTOMY AND ADENOIDECTOMY         Home Medications    Prior to Admission medications   Medication Sig Start Date End Date Taking? Authorizing Provider  ciprofloxacin (CIPRO) 500 MG tablet Take 500 mg by mouth 2 (two) times daily.  06/06/16  Yes Historical Provider, MD  doxazosin (CARDURA) 4 MG tablet Take 4 mg by mouth 2 (two) times daily.    Yes Historical Provider, MD    Family History Family History  Problem Relation Age of Onset  . Colon cancer Father 370    Social History Social History  Substance Use Topics  . Smoking status: Never Smoker  . Smokeless tobacco: Never Used  . Alcohol use 0.6 oz/week    1 Shots of liquor per week     Allergies   Review of patient's allergies indicates no known allergies.   Review of Systems Review of Systems  Genitourinary: Positive for difficulty urinating.  All other systems reviewed and are negative.    Physical Exam Updated Vital Signs BP 123/63   Pulse 61   Temp 97.7 F (36.5 C) (Oral)   Resp 16   Ht 6\' 3"  (1.905 m)   Wt 177 lb 12.8 oz (80.6 kg)   SpO2 96%   BMI 22.22 kg/m   Physical Exam  Constitutional: He is oriented to person, place, and time. He appears  well-developed and well-nourished.  HENT:  Head: Normocephalic and atraumatic.  Right Ear: External ear normal.  Left Ear: External ear normal.  Nose: Nose normal.  Mouth/Throat: Oropharynx is clear and moist.  Eyes: Conjunctivae and EOM are normal. Pupils are equal, round, and reactive to light.  Neck: Normal range of motion. Neck supple.  Cardiovascular: Normal rate, regular rhythm, normal heart sounds and intact distal pulses.   Pulmonary/Chest: Effort normal and breath sounds normal.  Abdominal: Soft. There is tenderness in the suprapubic area.  Bladder palpable and uncomfortable for patient.  Musculoskeletal: Normal range of motion.  Neurological: He is alert and oriented to person, place, and time.  Skin: Skin is warm.  Psychiatric: He has a normal mood and affect. His behavior is normal. Judgment and thought content normal.  Nursing note and vitals reviewed.    ED Treatments / Results  Labs (all labs ordered are listed, but only abnormal results are displayed) Labs Reviewed  URINALYSIS, ROUTINE W REFLEX MICROSCOPIC (NOT [facility]) - Abnormal; Notable for the following:       Result Value   Hgb urine dipstick LARGE (*)    All other components within normal limits  URINE CULTURE  URINE MICROSCOPIC-ADD  ON    EKG  EKG Interpretation None       Radiology No results found.  Procedures Procedures (including critical care time)  Medications Ordered in ED Medications - No data to display   Initial Impression / Assessment and Plan / ED Course  I have reviewed the triage vital signs and the nursing notes.  Pertinent labs & imaging results that were available during my care of the patient were reviewed by me and considered in my medical decision making (see chart for details).  Clinical Course    Pt is feeling much better after the foley catheter.  The pt will be d/c'd home with the catheter.  He is instructed to f/u with urology about the retention and the  hematuria.  Final Clinical Impressions(s) / ED Diagnoses   Final diagnoses:  Urinary retention  Gross hematuria    New Prescriptions New Prescriptions   No medications on file     Jacalyn Lefevre, MD 06/07/16 1735

## 2016-06-07 NOTE — ED Notes (Signed)
Bladder scan showed urine

## 2016-06-07 NOTE — Telephone Encounter (Signed)
Patient called urology on call today with complaints of urinary retention. Not able to urinate for last 4 hours despite strong desire/urge to do so. Urinary stream has slowly trickled. No n/v, fever, back pain. + suprapubic discomfort. When he pushes on abdomen pain is more significant. History of BPH, LUTS, UTI. Finished recent course of ciprofloxacin for enterococcus UTI diagnosed at 05/27/16 visit.  [facility], specifically bladder scan. Patient aware that he may require Foley catheterization. Advised him to discontinue myrbetriq. Would recommend 18Fr 2-way coude catheter if catheter required.

## 2016-06-07 NOTE — ED Triage Notes (Signed)
Pt reports being unable to urinate since 1030. Hx of prostate enlargement

## 2016-06-07 NOTE — ED Notes (Signed)
Changed standard drainage bag to leg bag and instructed patient on how to empty bag.  Also gave patient standard drainage bag for use at night time and how to hook up and empty.

## 2016-06-08 LAB — URINE CULTURE: Culture: NO GROWTH

## 2016-06-12 DIAGNOSIS — N3000 Acute cystitis without hematuria: Secondary | ICD-10-CM | POA: Diagnosis not present

## 2016-06-12 DIAGNOSIS — N401 Enlarged prostate with lower urinary tract symptoms: Secondary | ICD-10-CM | POA: Diagnosis not present

## 2016-06-12 DIAGNOSIS — R338 Other retention of urine: Secondary | ICD-10-CM | POA: Diagnosis not present

## 2016-06-18 DIAGNOSIS — K635 Polyp of colon: Secondary | ICD-10-CM | POA: Diagnosis not present

## 2016-06-18 DIAGNOSIS — R339 Retention of urine, unspecified: Secondary | ICD-10-CM | POA: Diagnosis not present

## 2016-06-18 DIAGNOSIS — M1990 Unspecified osteoarthritis, unspecified site: Secondary | ICD-10-CM | POA: Diagnosis not present

## 2016-06-18 DIAGNOSIS — Z23 Encounter for immunization: Secondary | ICD-10-CM | POA: Diagnosis not present

## 2016-06-18 DIAGNOSIS — E663 Overweight: Secondary | ICD-10-CM | POA: Diagnosis not present

## 2016-06-18 DIAGNOSIS — N62 Hypertrophy of breast: Secondary | ICD-10-CM | POA: Diagnosis not present

## 2016-06-18 DIAGNOSIS — R079 Chest pain, unspecified: Secondary | ICD-10-CM | POA: Diagnosis not present

## 2016-06-18 DIAGNOSIS — R972 Elevated prostate specific antigen [PSA]: Secondary | ICD-10-CM | POA: Diagnosis not present

## 2016-06-18 DIAGNOSIS — E784 Other hyperlipidemia: Secondary | ICD-10-CM | POA: Diagnosis not present

## 2016-06-25 DIAGNOSIS — N401 Enlarged prostate with lower urinary tract symptoms: Secondary | ICD-10-CM | POA: Diagnosis not present

## 2016-06-25 DIAGNOSIS — R338 Other retention of urine: Secondary | ICD-10-CM | POA: Diagnosis not present

## 2016-06-26 ENCOUNTER — Encounter (HOSPITAL_BASED_OUTPATIENT_CLINIC_OR_DEPARTMENT_OTHER): Payer: Self-pay | Admitting: *Deleted

## 2016-06-26 ENCOUNTER — Other Ambulatory Visit: Payer: Self-pay | Admitting: Urology

## 2016-06-26 NOTE — Progress Notes (Signed)
NPO AFTER MN.  ARRIVE AT [id number].  NEEDS HG.  WILL TAKE FLOMAX AM DOS W/ SIPS OF WATER.

## 2016-06-27 ENCOUNTER — Ambulatory Visit (HOSPITAL_BASED_OUTPATIENT_CLINIC_OR_DEPARTMENT_OTHER): Payer: PPO | Admitting: Anesthesiology

## 2016-06-27 ENCOUNTER — Encounter (HOSPITAL_COMMUNITY)
Admission: RE | Disposition: A | Discharge status: Home / Self Care | Payer: Self-pay | Source: Ambulatory Visit | Attending: Urology

## 2016-06-27 ENCOUNTER — Encounter (HOSPITAL_BASED_OUTPATIENT_CLINIC_OR_DEPARTMENT_OTHER): Payer: Self-pay | Admitting: *Deleted

## 2016-06-27 ENCOUNTER — Ambulatory Visit (HOSPITAL_BASED_OUTPATIENT_CLINIC_OR_DEPARTMENT_OTHER)
Admission: RE | Admit: 2016-06-27 | Discharge: 2016-06-28 | Disposition: A | Discharge status: Home / Self Care | Payer: PPO | Source: Ambulatory Visit | Attending: Urology | Admitting: Urology

## 2016-06-27 DIAGNOSIS — R338 Other retention of urine: Secondary | ICD-10-CM | POA: Insufficient documentation

## 2016-06-27 DIAGNOSIS — Z86010 Personal history of colonic polyps: Secondary | ICD-10-CM | POA: Diagnosis not present

## 2016-06-27 DIAGNOSIS — N320 Bladder-neck obstruction: Secondary | ICD-10-CM | POA: Insufficient documentation

## 2016-06-27 DIAGNOSIS — M1990 Unspecified osteoarthritis, unspecified site: Secondary | ICD-10-CM | POA: Insufficient documentation

## 2016-06-27 DIAGNOSIS — N138 Other obstructive and reflux uropathy: Secondary | ICD-10-CM | POA: Diagnosis present

## 2016-06-27 DIAGNOSIS — N210 Calculus in bladder: Secondary | ICD-10-CM | POA: Insufficient documentation

## 2016-06-27 DIAGNOSIS — R339 Retention of urine, unspecified: Secondary | ICD-10-CM | POA: Diagnosis not present

## 2016-06-27 DIAGNOSIS — N401 Enlarged prostate with lower urinary tract symptoms: Secondary | ICD-10-CM | POA: Diagnosis not present

## 2016-06-27 HISTORY — DX: Unspecified osteoarthritis, unspecified site: M19.90

## 2016-06-27 HISTORY — DX: Presence of urogenital implants: Z96.0

## 2016-06-27 HISTORY — DX: Presence of other specified devices: Z97.8

## 2016-06-27 HISTORY — PX: TRANSURETHRAL RESECTION OF PROSTATE: SHX73

## 2016-06-27 HISTORY — DX: Retention of urine, unspecified: R33.9

## 2016-06-27 HISTORY — DX: Diverticulosis of large intestine without perforation or abscess without bleeding: K57.30

## 2016-06-27 HISTORY — DX: Benign prostatic hyperplasia without lower urinary tract symptoms: N40.0

## 2016-06-27 HISTORY — DX: Presence of spectacles and contact lenses: Z97.3

## 2016-06-27 HISTORY — DX: Personal history of colonic polyps: Z86.010

## 2016-06-27 HISTORY — DX: Hematuria, unspecified: R31.9

## 2016-06-27 HISTORY — DX: Personal history of adenomatous and serrated colon polyps: Z86.0101

## 2016-06-27 LAB — POCT HEMOGLOBIN-HEMACUE: Hemoglobin: 13.9 g/dL (ref 13.0–17.0)

## 2016-06-27 SURGERY — TURP (TRANSURETHRAL RESECTION OF PROSTATE)
Anesthesia: General | Site: Prostate

## 2016-06-27 MED ORDER — HYDROMORPHONE HCL 1 MG/ML IJ SOLN
INTRAMUSCULAR | Status: AC
  Filled 2016-06-27: qty 1

## 2016-06-27 MED ORDER — SODIUM CHLORIDE 0.9 % IR SOLN
Status: DC | PRN
  Administered 2016-06-27: 27000 mL
  Administered 2016-06-27: 3000 mL

## 2016-06-27 MED ORDER — SODIUM CHLORIDE 0.9 % IR SOLN
3000.0000 mL | Status: DC
  Administered 2016-06-27: 3000 mL
  Filled 2016-06-27: qty 3000

## 2016-06-27 MED ORDER — ONDANSETRON HCL 4 MG/2ML IJ SOLN
INTRAMUSCULAR | Status: AC
  Filled 2016-06-27: qty 2

## 2016-06-27 MED ORDER — STERILE WATER FOR IRRIGATION IR SOLN
Status: DC | PRN
  Administered 2016-06-27: 500 mL

## 2016-06-27 MED ORDER — OXYCODONE-ACETAMINOPHEN 10-325 MG PO TABS: 1.0000 | ORAL_TABLET | ORAL | 0 refills | Status: DC | PRN

## 2016-06-27 MED ORDER — WHITE PETROLATUM GEL
Status: AC
  Filled 2016-06-27: qty 5

## 2016-06-27 MED ORDER — GLYCOPYRROLATE 0.2 MG/ML IV SOSY
PREFILLED_SYRINGE | INTRAVENOUS | Status: AC
  Filled 2016-06-27: qty 3

## 2016-06-27 MED ORDER — CIPROFLOXACIN IN D5W 400 MG/200ML IV SOLN
400.0000 mg | Freq: Two times a day (BID) | INTRAVENOUS | Status: DC
  Administered 2016-06-27: 400 mg via INTRAVENOUS
  Filled 2016-06-27: qty 200

## 2016-06-27 MED ORDER — BELLADONNA ALKALOIDS-OPIUM 16.2-60 MG RE SUPP: 1.0000 | Freq: Four times a day (QID) | RECTAL | Status: DC | PRN

## 2016-06-27 MED ORDER — HYDROMORPHONE HCL 1 MG/ML IJ SOLN
0.5000 mg | INTRAMUSCULAR | Status: DC | PRN
  Administered 2016-06-27: 1 mg via INTRAVENOUS
  Filled 2016-06-27: qty 1

## 2016-06-27 MED ORDER — SODIUM CHLORIDE 0.9 % IV SOLN
INTRAVENOUS | Status: DC
  Administered 2016-06-27 – 2016-06-28 (×2): via INTRAVENOUS
  Filled 2016-06-27: qty 1000

## 2016-06-27 MED ORDER — FENTANYL CITRATE (PF) 100 MCG/2ML IJ SOLN
INTRAMUSCULAR | Status: AC
  Filled 2016-06-27: qty 2

## 2016-06-27 MED ORDER — 0.9 % SODIUM CHLORIDE (POUR BTL) OPTIME
TOPICAL | Status: DC | PRN
  Administered 2016-06-27: 500 mL

## 2016-06-27 MED ORDER — ONDANSETRON HCL 4 MG/2ML IJ SOLN
INTRAMUSCULAR | Status: DC | PRN
  Administered 2016-06-27: 4 mg via INTRAVENOUS

## 2016-06-27 MED ORDER — HYDROMORPHONE HCL 1 MG/ML IJ SOLN
0.2500 mg | INTRAMUSCULAR | Status: DC | PRN
  Administered 2016-06-27 (×2): 0.5 mg via INTRAVENOUS
  Filled 2016-06-27: qty 0.5

## 2016-06-27 MED ORDER — EPHEDRINE 5 MG/ML INJ
INTRAVENOUS | Status: AC
  Filled 2016-06-27: qty 10

## 2016-06-27 MED ORDER — PROPOFOL 10 MG/ML IV BOLUS
INTRAVENOUS | Status: DC | PRN
  Administered 2016-06-27: 200 mg via INTRAVENOUS

## 2016-06-27 MED ORDER — LACTATED RINGERS IV SOLN
INTRAVENOUS | Status: DC
  Administered 2016-06-27 (×2): via INTRAVENOUS
  Filled 2016-06-27: qty 1000

## 2016-06-27 MED ORDER — DEXAMETHASONE SODIUM PHOSPHATE 4 MG/ML IJ SOLN
INTRAMUSCULAR | Status: DC | PRN
  Administered 2016-06-27: 10 mg via INTRAVENOUS

## 2016-06-27 MED ORDER — ONDANSETRON HCL 4 MG/2ML IJ SOLN: 4.0000 mg | INTRAMUSCULAR | Status: DC | PRN

## 2016-06-27 MED ORDER — CIPROFLOXACIN IN D5W 400 MG/200ML IV SOLN
400.0000 mg | INTRAVENOUS | Status: AC
  Administered 2016-06-27: 400 mg via INTRAVENOUS
  Filled 2016-06-27: qty 200

## 2016-06-27 MED ORDER — EPHEDRINE SULFATE-NACL 50-0.9 MG/10ML-% IV SOSY
PREFILLED_SYRINGE | INTRAVENOUS | Status: DC | PRN
  Administered 2016-06-27: .2 mg via INTRAVENOUS

## 2016-06-27 MED ORDER — LIDOCAINE 2% (20 MG/ML) 5 ML SYRINGE
INTRAMUSCULAR | Status: AC
  Filled 2016-06-27: qty 5

## 2016-06-27 MED ORDER — CIPROFLOXACIN IN D5W 400 MG/200ML IV SOLN
INTRAVENOUS | Status: AC
  Filled 2016-06-27: qty 200

## 2016-06-27 MED ORDER — PROMETHAZINE HCL 25 MG/ML IJ SOLN
6.2500 mg | INTRAMUSCULAR | Status: DC | PRN
  Filled 2016-06-27: qty 1

## 2016-06-27 MED ORDER — PHENAZOPYRIDINE HCL 200 MG PO TABS: 200.0000 mg | ORAL_TABLET | Freq: Three times a day (TID) | ORAL | 0 refills | Status: DC | PRN

## 2016-06-27 MED ORDER — FENTANYL CITRATE (PF) 100 MCG/2ML IJ SOLN
INTRAMUSCULAR | Status: AC
Start: 2016-06-27 — End: 2016-06-27
  Filled 2016-06-27: qty 2

## 2016-06-27 MED ORDER — PROPOFOL 10 MG/ML IV BOLUS
INTRAVENOUS | Status: AC
  Filled 2016-06-27: qty 20

## 2016-06-27 MED ORDER — ACETAMINOPHEN 325 MG PO TABS: 650.0000 mg | ORAL_TABLET | ORAL | Status: DC | PRN

## 2016-06-27 MED ORDER — LIDOCAINE 2% (20 MG/ML) 5 ML SYRINGE
INTRAMUSCULAR | Status: DC | PRN
  Administered 2016-06-27: 60 mg via INTRAVENOUS

## 2016-06-27 MED ORDER — FENTANYL CITRATE (PF) 100 MCG/2ML IJ SOLN
INTRAMUSCULAR | Status: DC | PRN
  Administered 2016-06-27: 50 ug via INTRAVENOUS
  Administered 2016-06-27 (×3): 25 ug via INTRAVENOUS

## 2016-06-27 MED ORDER — BACITRACIN-NEOMYCIN-POLYMYXIN 400-5-5000 EX OINT
1.0000 | TOPICAL_OINTMENT | Freq: Three times a day (TID) | CUTANEOUS | Status: DC | PRN
Start: 2016-06-27 — End: 2016-06-28

## 2016-06-27 MED ORDER — DEXAMETHASONE SODIUM PHOSPHATE 10 MG/ML IJ SOLN
INTRAMUSCULAR | Status: AC
  Filled 2016-06-27: qty 1

## 2016-06-27 MED ORDER — HYDROCODONE-ACETAMINOPHEN 5-325 MG PO TABS: 1.0000 | ORAL_TABLET | ORAL | Status: DC | PRN

## 2016-06-27 SURGICAL SUPPLY — 35 items
BAG DRAIN URO-CYSTO SKYTR STRL (DRAIN) ×2 IMPLANT
BAG DRN ANRFLXCHMBR STRAP LEK (BAG)
BAG DRN UROCATH (DRAIN) ×1
BAG URINE DRAINAGE (UROLOGICAL SUPPLIES) ×1 IMPLANT
BAG URINE LEG 19OZ MD ST LTX (BAG) IMPLANT
CATH FOLEY 3WAY 20FR (CATHETERS) IMPLANT
CATH FOLEY 3WAY 30CC 22F (CATHETERS) ×1 IMPLANT
CATH FOLEY 3WAY 30CC 24FR (CATHETERS) ×2
CATH HEMA 3WAY 30CC 24FR COUDE (CATHETERS) IMPLANT
CATH HEMA 3WAY 30CC 24FR RND (CATHETERS) IMPLANT
CATH URTH STD 24FR FL 3W 2 (CATHETERS) ×1 IMPLANT
CLOTH BEACON ORANGE TIMEOUT ST (SAFETY) ×2 IMPLANT
ELECT REM PT RETURN 9FT ADLT (ELECTROSURGICAL) ×2
ELECTRODE REM PT RTRN 9FT ADLT (ELECTROSURGICAL) ×1 IMPLANT
EVACUATOR MICROVAS BLADDER (UROLOGICAL SUPPLIES) ×1 IMPLANT
GLOVE BIO SURGEON STRL SZ 6.5 (GLOVE) ×1 IMPLANT
GLOVE BIO SURGEON STRL SZ8 (GLOVE) ×2 IMPLANT
GLOVE INDICATOR 6.5 STRL GRN (GLOVE) ×2 IMPLANT
GOWN STRL REUS W/ TWL LRG LVL3 (GOWN DISPOSABLE) ×1 IMPLANT
GOWN STRL REUS W/ TWL XL LVL3 (GOWN DISPOSABLE) ×1 IMPLANT
GOWN STRL REUS W/TWL LRG LVL3 (GOWN DISPOSABLE) ×2
GOWN STRL REUS W/TWL XL LVL3 (GOWN DISPOSABLE) ×4 IMPLANT
HOLDER FOLEY CATH W/STRAP (MISCELLANEOUS) ×1 IMPLANT
IV NS IRRIG 3000ML ARTHROMATIC (IV SOLUTION) ×10 IMPLANT
KIT ROOM TURNOVER WOR (KITS) ×2 IMPLANT
LOOP CUT BIPOLAR 24F LRG (ELECTROSURGICAL) ×1 IMPLANT
MANIFOLD NEPTUNE II (INSTRUMENTS) ×2 IMPLANT
NS IRRIG 500ML POUR BTL (IV SOLUTION) ×1 IMPLANT
PACK CYSTO (CUSTOM PROCEDURE TRAY) ×2 IMPLANT
PLUG CATH AND CAP STER (CATHETERS) IMPLANT
SYR 30ML LL (SYRINGE) ×1 IMPLANT
SYRINGE IRR TOOMEY STRL 70CC (SYRINGE) IMPLANT
TUBE CONNECTING 12X1/4 (SUCTIONS) ×1 IMPLANT
WATER STERILE IRR 3000ML UROMA (IV SOLUTION) IMPLANT
WATER STERILE IRR 500ML POUR (IV SOLUTION) ×1 IMPLANT

## 2016-06-27 NOTE — Transfer of Care (Signed)
Immediate Anesthesia Transfer of Care Note  Patient: Cody Hudson  Procedure(s) Performed: Procedure(s) (LRB): TRANSURETHRAL RESECTION OF THE PROSTATE (TURP) WITH GYRUS (N/A)  Patient Location: PACU  Anesthesia Type: General  Level of Consciousness: awake, oriented, sedated and patient cooperative  Airway & Oxygen Therapy: Patient Spontanous Breathing and Patient connected to face mask oxygen  Post-op Assessment: Report given to PACU RN and Post -op Vital signs reviewed and stable  Post vital signs: Reviewed and stable  Complications: No apparent anesthesia complications

## 2016-06-27 NOTE — Anesthesia Preprocedure Evaluation (Signed)
Anesthesia Evaluation  Patient identified by MRN, date of birth, ID band Patient awake    Reviewed: Allergy & Precautions, NPO status , Patient's Chart, lab work & pertinent test results  Airway Mallampati: II  TM Distance: >3 FB Neck ROM: Full    Dental no notable dental hx.    Pulmonary neg pulmonary ROS,    Pulmonary exam normal breath sounds clear to auscultation       Cardiovascular negative cardio ROS Normal cardiovascular exam Rhythm:Regular Rate:Normal     Neuro/Psych negative neurological ROS  negative psych ROS   GI/Hepatic negative GI ROS, Neg liver ROS,   Endo/Other  negative endocrine ROS  Renal/GU negative Renal ROS  negative genitourinary   Musculoskeletal negative musculoskeletal ROS (+)   Abdominal   Peds negative pediatric ROS (+)  Hematology negative hematology ROS (+)   Anesthesia Other Findings   Reproductive/Obstetrics negative OB ROS                             Anesthesia Physical Anesthesia Plan  ASA: II  Anesthesia Plan: General   Post-op Pain Management:    Induction: Intravenous  Airway Management Planned: LMA  Additional Equipment:   Intra-op Plan:   Post-operative Plan: Extubation in OR  Informed Consent: I have reviewed the patients History and Physical, chart, labs and discussed the procedure including the risks, benefits and alternatives for the proposed anesthesia with the patient or authorized representative who has indicated his/her understanding and acceptance.   Dental advisory given  Plan Discussed with: CRNA and Surgeon  Anesthesia Plan Comments:         Anesthesia Quick Evaluation  

## 2016-06-27 NOTE — Op Note (Signed)
PATIENT:  Cody Hudson  PRE-OPERATIVE DIAGNOSIS: BPH with urinary retention  POST-OPERATIVE DIAGNOSIS: 1. BPH with urinary retention 2. Bladder calculus  PROCEDURE: 1. TURP 2. Removal of bladder calculus.  SURGEON:  Garnett FarmMark C Jaymz Traywick  INDICATION: Cody Hudson is a 82 year old male with a known history of BPH with outlet obstruction. He developed urinary retention and has undergone multiple voiding trials on alpha blockade therapy without success. We discussed the options and he has elected to proceed with transurethral resection of his prostate.  ANESTHESIA:  General  EBL:  Minimal  DRAINS: 22 French three-way Foley catheter  SPECIMEN:  1. Prostate chips to pathology 2. Bladder stone to patient  After informed consent the patient was brought to the major OR and placed on the table. He was administered general anesthesia and then moved to the dorsal lithotomy position. His genitalia was sterilely prepped and draped and an official timeout was then performed.  Initially the 28 French resectoscope sheath with the visual obturator was passed into the bladder and the obturator removed. I then inserted the resectoscope element with 30 lens and  performed a systematic inspection of the bladder. I noted the bladder had 4+ trabeculation with multiple pseudo-diverticuli but was free of any tumors or inflammatory lesions. The ureteral orifices were noted to be of normal configuration and position. Withdrawing the scope into the prostatic urethra I noted obstructing bilobar hypertrophy with elongation of the prostatic urethra and a large median lobe component. I also noted a bladder stone on the floor of the bladder. It had a jack stone configuration.  Resection was then begun. I first resected the median lobe in the midline down to the level of the bladder neck. I then began resecting the left lobe of the prostate by resecting first from the level of the bladder neck back to the level of the Veru  at the 5:00 position and then progressed in a counterclockwise direction resecting all of the adenomatous tissue of the left lobe down to the surgical capsule. Bleeding points were cauterized as they were encountered. I then turned my attention to the right lobe of the prostate and it was resected in an identical fashion. Tissue in the area of the apex was then resected circumferentially with care being taken to maintain the resection proximal to the Veru at all times. The prostatic chips were then flushed into the bladder and the Microvasive evacuator was then used to evacuate all chips from the bladder. Reinspection of the bladder revealed the mucosa to be intact, the ureteral orifices intact as well and well away from the bladder neck and area of resection. There were no prostatic chips remaining within the bladder. The prostatic capsule was intact throughout with no perforation and there was no active bleeding noted at the end of the procedure.  I inspected the bladder and noted the stone was no longer present. I checked the prostate chips that had been irrigated and found the stone amongst the prostate chips.  The resectoscope was therefore removed and the 22 French three-way Foley catheter was then inserted and balloon was filled to 30 cc. This was placed on mild traction and the bladder was irrigated with the irrigant returning clear. The catheter was then hooked to closed system drainage and continuous irrigation and the patient was awakened and taken to recovery room in stable and satisfactory condition. He tolerated the procedure well and there were no intraoperative complications.  PLAN OF CARE: Observation overnight with anticipated discharge in the  morning.  PATIENT DISPOSITION:  PACU - Hemodynamically stable.

## 2016-06-27 NOTE — H&P (Signed)
HPI: Cody Hudson is a 82 year-old male with urinary retention.  His problem was diagnosed 06/07/2016. His current symptoms did not begin after he had a surgical procedure. He currently has an indwelling catheter. The catheter has been in his bladder for 06/07/2016. His urinary retention is being treated with foley catheter and flomax. Patient denies suprapubic tube, intemittent catheterization, hytrin, cardura, uroxatrol, rapaflo, avodart, and proscar.   His urine has shut off completely.   He is not having problems with urinary control or incontinence.   He has previously had an indwelling catheter in for more than two weeks at a time.   When I saw him initially on 05/27/16 he was having voiding symptoms suggested a UTI and had an elevated PVR of 118 cc. At that time he was started on Rapaflo and his urine culture grew Enterococcus. He was placed on antibiotics.  06/07/16 - He presented to the emergency room having been unable to urinate and had a Foley catheter placed. Volume drained was not recorded in the ER note.   06/12/16 - attempt at voiding trial was undertaken. He initially voided but then developed inability to urinate and returned to have a Foley catheter placed.   Interval history 06/25/16: He is anxious to get the catheter around. He remains on antibiotics and Flomax.     ALLERGIES: No Allergies    MEDICATIONS: Cipro 500 mg tablet 1 tablet PO BID  Flomax 0.4 mg capsule, ext release 24 hr     GU PSH: None     PSH Notes: No Surgical Problems, Shoulder Surgery   NON-GU PSH: Shoulder Surgery (Unspecified)    GU PMH: Urinary Retention - 06/12/2016 Acute Cystitis (Acute), His fairly sudden onset of symptoms as well as finding no significant pyuria and bacteriuria suggest the presence of UTI. His urine will better. I will therefore culture his urine. - 05/27/2016 BPH w/LUTS (Worsening), He has had worsening symptoms. I am going to place him on an alpha-blocker as a trial. -  05/27/2016, BPH associated with nocturia, - 01/24/2015 Nocturia, I'm going to culture his urine and treat any infection present and then see if he notes Any improvement in his urination with the samples that I have given him today.History of all. He is currently voiding describes it. Extreme bladder completely. He has urinary incontinence and proximal. 6. - 05/27/2016 Urinary Frequency, His frequency and nocturia appear to possibly be due to a UTI. - 05/27/2016 Dysuria, Dysuria - 01/24/2015 Elevated PSA, Elevated prostate specific antigen (PSA) - 2014      PMH Notes: Elevated PSA: In 2008 PSA had doubled over a one year period, going from 2.1, to 4.16. I had seen him previously for a PSA elevation and back in 11/05 when his PSA went up from 2.2 to 3.6 over a year's time. I rechecked it, and it was back down to 2.25 and then a year after I first saw him, it was still down at 2.64. He is known to have an enlarged prostate.   BPH with nocturia: He had reported in the past having experienced nocturia 1-2 times.     NON-GU PMH: Encounter for general adult medical examination without abnormal findings, Encounter for preventive health examination    FAMILY HISTORY: Colon Cancer - Runs In Family Death In The Family Father - Father Death In The Family Mother - Mother Family Health Status Number - Runs In Family Kidney Stones - Runs In Family   SOCIAL HISTORY: Marital Status: Married Current Smoking Status:  Patient has never smoked.  Social Drinker.  Drinks 2 caffeinated drinks per day. Patient's occupation is/was Retired.    REVIEW OF SYSTEMS:    GU Review Male:   Patient reports burning/ pain with urination. Patient denies frequent urination, hard to postpone urination, get up at night to urinate, leakage of urine, stream starts and stops, trouble starting your stream, have to strain to urinate , erection problems, and penile pain.  Gastrointestinal (Upper):   Patient denies nausea, vomiting, and  indigestion/ heartburn.  Gastrointestinal (Lower):   Patient denies diarrhea and constipation.  Constitutional:   Patient denies fever, night sweats, weight loss, and fatigue.  Skin:   Patient denies skin rash/ lesion and itching.  Eyes:   Patient denies blurred vision and double vision.  Ears/ Nose/ Throat:   Patient denies sore throat and sinus problems.  Hematologic/Lymphatic:   Patient denies swollen glands and easy bruising.  Cardiovascular:   Patient denies leg swelling and chest pains.  Respiratory:   Patient denies cough and shortness of breath.  Endocrine:   Patient denies excessive thirst.  Musculoskeletal:   Patient denies back pain and joint pain.  Neurological:   Patient denies headaches and dizziness.  Psychologic:   Patient denies depression and anxiety.   VITAL SIGNS:     Weight 215 lb / 97.52 kg  Height 75 in / 190.5 cm  BP 115/66 mmHg  Pulse 52 /min  BMI 26.9 kg/m   Physical Exam  Constitutional: Well nourished and well developed . No acute distress.   ENT:. The ears and nose are normal in appearance.   Neck: The appearance of the neck is normal and no neck mass is present.   Pulmonary: No respiratory distress and normal respiratory rhythm and effort.   Cardiovascular: Heart rate and rhythm are normal . No peripheral edema.   Abdomen: The abdomen is soft and nontender. No masses are palpated. No CVA tenderness. No hernias are palpable. No hepatosplenomegaly noted.   Rectal: Rectal exam demonstrates normal sphincter tone, no tenderness and no masses. Estimated prostate size is 2+. The prostate has no nodularity and is not tender. The left seminal vesicle is nonpalpable. The right seminal vesicle is nonpalpable. The perineum is normal on inspection.   Genitourinary: Examination of the penis demonstrates no discharge, no masses, no lesions and a normal meatus.  Foley catheter is in place and draining clear urine.  The scrotum is without lesions. The right  epididymis is palpably normal and non-tender. The left epididymis is palpably normal and non-tender. The right testis is non-tender and without masses. The left testis is non-tender and without masses.   Lymphatics: The femoral and inguinal nodes are not enlarged or tender.   Skin: Normal skin turgor, no visible rash and no visible skin lesions.   Neuro/Psych:. Mood and affect are appropriate.    ASSESSMENT:      ICD-10 Details  1 GU:   Urinary Retention - R33.8 Stable - He had a lot of bladder spasms again so I left the catheter out, increased his Flomax to 0.8 mg however he still was unable to urinate.   2   BPH w/LUTS - N40.1 Stable - He has BPH and is interested in a transurethral resection of the prostate. We discussed the options for further management of his urinary retention. I discussed continued Foley catheter drainage, placement of a suprapubic tube and self-catheterization. He was interested in surgical management so we discussed transurethral resection of his prostate. I went over  the procedure with him in detail, the probability of success, the need for an overnight stay and the anticipated postoperative course. He is currently on antibiotics and will remain on them up until surgery.    PLAN: TURP

## 2016-06-27 NOTE — Anesthesia Postprocedure Evaluation (Signed)
Anesthesia Post Note  Patient: Cody Hudson  Procedure(s) Performed: Procedure(s) (LRB): TRANSURETHRAL RESECTION OF THE PROSTATE (TURP) WITH GYRUS: RMOVAL BLADDER STONE (N/A)  Patient location during evaluation: PACU Anesthesia Type: General Level of consciousness: awake and sedated Pain management: pain level controlled Vital Signs Assessment: post-procedure vital signs reviewed and stable Respiratory status: spontaneous breathing Cardiovascular status: stable Postop Assessment: no signs of nausea or vomiting Anesthetic complications: no     Last Vitals:  Vitals:   06/27/16 1500 06/27/16 1515  BP: 127/64 127/67  Pulse: (!) 50 (!) 56  Resp: 14 15  Temp:  36.4 C    Last Pain:  Vitals:   06/27/16 1515  TempSrc:   PainSc: 3    Pain Goal: Patients Stated Pain Goal: 6 (06/27/16 0850)               Jenniferlynn Saad JR,JOHN Susann GivensFRANKLIN

## 2016-06-27 NOTE — Anesthesia Procedure Notes (Signed)
Procedure Name: LMA Insertion Date/Time: 06/27/2016 12:53 PM Performed by: Renella CunasHAZEL, Kiren Mcisaac D Pre-anesthesia Checklist: Patient identified, Emergency Drugs available, Suction available and Patient being monitored Patient Re-evaluated:Patient Re-evaluated prior to inductionOxygen Delivery Method: Circle system utilized Preoxygenation: Pre-oxygenation with 100% oxygen Intubation Type: IV induction Ventilation: Mask ventilation without difficulty LMA: LMA inserted LMA Size: 4.0 Number of attempts: 1 Airway Equipment and Method: Bite block Placement Confirmation: positive ETCO2 Tube secured with: Tape Dental Injury: Teeth and Oropharynx as per pre-operative assessment

## 2016-06-28 DIAGNOSIS — N401 Enlarged prostate with lower urinary tract symptoms: Secondary | ICD-10-CM | POA: Diagnosis not present

## 2016-06-28 NOTE — Discharge Summary (Signed)
Physician Discharge Summary  Patient ID: Cody Hudson MRN: [id number] DOB/AGE: 80-Apr-1935 80 y.o.  Admit date: 06/27/2016 Discharge date: 06/28/2016  Admission Diagnoses:  BPH with urinary obstruction  Discharge Diagnoses:  Principal Problem:   BPH with urinary obstruction   Past Medical History:  Diagnosis Date  . Arthritis   . BPH (benign prostatic hyperplasia)   . Diverticulosis of colon   . Foley catheter in place   . Hematuria   . History of adenomatous polyp of colon    2011-- adenomatous;   09-12-2015  tubular adenoma  . Urinary retention   . Wears glasses     Surgeries: Procedure(s): TRANSURETHRAL RESECTION OF THE PROSTATE (TURP) WITH GYRUS: RMOVAL BLADDER STONE on 06/27/2016   Consultants (if any):   Discharged Condition: Improved  Hospital Course: Cody Hudson is an 82 y.o. male who was admitted 06/27/2016 with a diagnosis of BPH with urinary obstruction and went to the operating room on 06/27/2016 and underwent the above named procedures.  His foley was removed this morning and he is voiding but has some dribbling.  The urine is light pink.   He has no discomfort.  I will get a bladder scan and discharge home if it is <17850ml.   He was given perioperative antibiotics:  Anti-infectives    Start     Dose/Rate Route Frequency Ordered Stop   06/28/16 0000  ciprofloxacin (CIPRO) IVPB 400 mg     400 mg 200 mL/hr over 60 Minutes Intravenous Every 12 hours 06/27/16 1549 06/28/16 2359   06/27/16 0931  ciprofloxacin (CIPRO) IVPB 400 mg     400 mg 200 mL/hr over 60 Minutes Intravenous 60 min pre-op 06/27/16 0931 06/27/16 1345    .  He was given sequential compression devices for DVT prophylaxis.  He benefited maximally from the hospital stay and there were no complications.    Recent vital signs:  Vitals:   06/28/16 0158 06/28/16 0516  BP: 126/61 128/65  Pulse: (!) 53 69  Resp: 16 16  Temp: 97.8 F (36.6 C) 97.7 F (36.5 C)    Recent laboratory studies:   Lab Results  Component Value Date   HGB 13.9 06/27/2016   No results found for: WBC, PLT No results found for: INR No results found for: NA, K, CL, CO2, BUN, CREATININE, GLUCOSE  Discharge Medications:     Medication List    TAKE these medications   ciprofloxacin 500 MG tablet Commonly known as:  CIPRO Take 500 mg by mouth 2 (two) times daily.   oxyCODONE-acetaminophen 10-325 MG tablet Commonly known as:  PERCOCET Take 1-2 tablets by mouth every 4 (four) hours as needed for pain. Maximum dose per 24 hours - 8 pills   phenazopyridine 200 MG tablet Commonly known as:  PYRIDIUM Take 1 tablet (200 mg total) by mouth 3 (three) times daily as needed for pain.   tamsulosin 0.4 MG Caps capsule Commonly known as:  FLOMAX Take 0.4 mg by mouth daily.       Diagnostic Studies: No results found.  Disposition: 01-Home or Self Care  Discharge Instructions    Discontinue IV    Complete by:  As directed       Follow-up Information    [facility] Follow up on 07/11/2016.   Why:  For your appiontment at 9:30 Contact information: [address]  [place] [address] [place] [address] [phone]            Signed: Anner CreteWRENN,Zi Sek J  06/28/2016, 8:03 AM

## 2016-06-28 NOTE — Discharge Instructions (Signed)
Transurethral Resection of the Prostate Transurethral resection of the prostate (TURP) is the removal of part of your prostate to treat noncancerous (benign) prostatic hyperplasia (BPH). BPH typically occurs in men older than 40 years. It is the abnormal growth of cells in your prostate. Specifically, it is an abnormal increase in the number of cells that make up your prostate tissue. This causes an increase in the size of your prostate. Often, in the case of BPH, the prostate becomes so large that it compresses the tube that drains urine out of your body from your bladder (urethra). Eventually, this compression can obstruct the flow of urine from your bladder. This obstruction can cause recurrent bladder infection and difficulties with bladder control and bladder emptying. The goal of TURP is to remove enough prostate tissue to allow for an unobstructed flow of urine, which often resolves the associated conditions. LET YOUR CAREGIVER KNOW ABOUT:  Any allergies you have.  Any medicines you are taking, including herbs, eye drops, over-the-counter medicines, and creams.  Any problems you have had with the use of anesthetics.  Any blood disorders you have, including bleeding problems and clotting problems.  Previous surgeries you have had.  Any prostate infections you have had. RISKS AND COMPLICATIONS Generally, TURP is a safe procedure. However, as with any surgical procedure, complications can occur. Possible complications associated with TURP include:  Difficulty getting an erection.  Scarring, which may cause problems with the flow of your urine.  Injury to your urethra.  Incontinence from injury to the muscle that surrounds your prostate, which controls urine flow.  Infection.  Bleeding.  Injury to your bladder (rare). BEFORE THE PROCEDURE  Your caregiver will tell you when you need to stop eating and drinking. If you take any medicines, your caregiver will tell you which ones  you may keep taking and which ones you will have to stop taking and when.  Just before the procedure you will also receive medicine to make you fall asleep (general anesthetic). This will be given through a tube that is inserted into one of your veins (intravenous [IV] tube). PROCEDURE Your surgeon inserts an instrument that is similar to a telescope with an electric cutting edge (resectoscope) through your urethra to the area of the prostate gland. The cutting edge is used to remove enlarged pieces of your prostate, one piece at a time. At the end of your procedure, a flexible tube (catheter) will be inserted into your urethra to drain your bladder. Special plastic bags filled with solution will be connected to the end of the catheter. The solution will be used to irrigate blood from your bladder while you heal.  AFTER THE PROCEDURE You will be taken to the recovery area. Once you are awake, stable, and taking fluids well, you will be taken to your hospital room. Typically, you will stay in the hospital 1-2 days after this procedure. The catheter usually is removed before discharge from the hospital.   This information is not intended to replace advice given to you by your health care provider. Make sure you discuss any questions you have with your health care provider.   Document Released: 08/11/2005 Document Revised: 09/01/2014 Document Reviewed: 01/12/2012 Elsevier Interactive Patient Education [employer]2016 [employer] Post transurethral resection of the prostate (TURP) instructions  Your recent prostate surgery requires very special post hospital care. Despite the fact that no skin incisions were used the area around the prostate incision is quite raw and is covered with a scab to  promote healing and prevent bleeding. Certain cautions are needed to assure that the scab is not disturbed of the next 2-3 weeks while the healing proceeds.  Because the raw surface in your prostate and the irritating  effects of urine you may expect frequency of urination and/or urgency (a stronger desire to urinate) and perhaps even getting up at night more often. This will usually resolve or improve slowly over the healing period. You may see some blood in your urine over the first 6 weeks. Do not be alarmed, even if the urine was clear for a while. Get off your feet and drink lots of fluids until clearing occurs. If you start to pass clots or don't improve call us.   Diet:  You may return to your normal diet immediately. Because of the raw surface of your bladder, alcohol, spicy foods, foods high in acid and drinks with caffeine may cause irritation or frequency and should be used in moderation. To keep your urine flowing freely and avoid constipation, drink plenty of fluids during the day (8-10 glasses). Tip: Avoid cranberry juice because it is very acidic.  Activity:  Your physical activity doesn't need to be restricted. However, if you are very active, you may see some blood in the urine. We suggest that you reduce your activity under the circumstances until the bleeding has stopped.  Bowels:  It is important to keep your bowels regular during the postoperative period. Straining with bowel movements can cause bleeding. A bowel movement every other day is reasonable. Use a mild laxative if needed, such as milk of magnesia 2-3 tablespoons, or 2 Dulcolax tablets. Call if you continue to have problems. If you had been taking narcotics for pain, before, during or after your surgery, you may be constipated. Take a laxative if necessary.  Medication:  You should resume your pre-surgery medications unless told not to. DO NOT RESUME YOUR ASPIRIN, WARFARIN, OR OTHER BLOOD THINNER FOR 1 WEEK. In addition you may be given an antibiotic to prevent or treat infection. Antibiotics are not always necessary. All medication should be taken as prescribed until the bottles are finished unless you are having an unusual reaction  to one of the drugs.     Problems you should report to us:  a. Fever greater than 101F. b. Heavy bleeding, or clots (see notes above about blood in urine). c. Inability to urinate. d. Drug reactions (hives, rash, nausea, vomiting, diarrhea). e. Severe burning or pain with urination that is not improving.

## 2016-06-28 NOTE — Progress Notes (Signed)
Completed D/C teaching. Gave prescriptions. Answered questions. Patient will be D/C home in stable condition with family.

## 2016-06-30 ENCOUNTER — Encounter (HOSPITAL_BASED_OUTPATIENT_CLINIC_OR_DEPARTMENT_OTHER): Payer: Self-pay | Admitting: Urology

## 2016-07-01 ENCOUNTER — Encounter (HOSPITAL_BASED_OUTPATIENT_CLINIC_OR_DEPARTMENT_OTHER): Payer: Self-pay | Admitting: Urology

## 2016-07-08 ENCOUNTER — Encounter (HOSPITAL_COMMUNITY): Payer: Self-pay | Admitting: Emergency Medicine

## 2016-07-08 ENCOUNTER — Emergency Department (HOSPITAL_BASED_OUTPATIENT_CLINIC_OR_DEPARTMENT_OTHER)
Admit: 2016-07-08 | Discharge: 2016-07-08 | Disposition: A | Discharge status: Home / Self Care | Payer: PPO | Attending: Emergency Medicine | Admitting: Emergency Medicine

## 2016-07-08 ENCOUNTER — Emergency Department (HOSPITAL_COMMUNITY)
Admission: EM | Admit: 2016-07-08 | Discharge: 2016-07-08 | Disposition: A | Discharge status: Home / Self Care | Payer: PPO | Attending: Emergency Medicine | Admitting: Emergency Medicine

## 2016-07-08 DIAGNOSIS — Z79899 Other long term (current) drug therapy: Secondary | ICD-10-CM | POA: Insufficient documentation

## 2016-07-08 DIAGNOSIS — M7989 Other specified soft tissue disorders: Secondary | ICD-10-CM | POA: Diagnosis not present

## 2016-07-08 DIAGNOSIS — M79609 Pain in unspecified limb: Secondary | ICD-10-CM

## 2016-07-08 DIAGNOSIS — Z7902 Long term (current) use of antithrombotics/antiplatelets: Secondary | ICD-10-CM | POA: Insufficient documentation

## 2016-07-08 DIAGNOSIS — I82812 Embolism and thrombosis of superficial veins of left lower extremities: Secondary | ICD-10-CM | POA: Diagnosis not present

## 2016-07-08 LAB — URINALYSIS, ROUTINE W REFLEX MICROSCOPIC
BILIRUBIN URINE: NEGATIVE
Glucose, UA: NEGATIVE mg/dL
KETONES UR: NEGATIVE mg/dL
NITRITE: NEGATIVE
PROTEIN: 100 mg/dL — AB
Specific Gravity, Urine: 1.022 (ref 1.005–1.030)
pH: 5.5 (ref 5.0–8.0)

## 2016-07-08 LAB — CBC WITH DIFFERENTIAL/PLATELET
BASOS ABS: 0 10*3/uL (ref 0.0–0.1)
BASOS PCT: 0 %
EOS ABS: 0.2 10*3/uL (ref 0.0–0.7)
Eosinophils Relative: 2 %
HCT: 33.4 % — ABNORMAL LOW (ref 39.0–52.0)
Hemoglobin: 11 g/dL — ABNORMAL LOW (ref 13.0–17.0)
Lymphocytes Relative: 20 %
Lymphs Abs: 1.4 10*3/uL (ref 0.7–4.0)
MCH: 29.3 pg (ref 26.0–34.0)
MCHC: 32.9 g/dL (ref 30.0–36.0)
MCV: 88.8 fL (ref 78.0–100.0)
MONO ABS: 0.5 10*3/uL (ref 0.1–1.0)
MONOS PCT: 6 %
Neutro Abs: 5.2 10*3/uL (ref 1.7–7.7)
Neutrophils Relative %: 72 %
PLATELETS: 165 10*3/uL (ref 150–400)
RBC: 3.76 MIL/uL — ABNORMAL LOW (ref 4.22–5.81)
RDW: 13.4 % (ref 11.5–15.5)
WBC: 7.3 10*3/uL (ref 4.0–10.5)

## 2016-07-08 LAB — COMPREHENSIVE METABOLIC PANEL
ALBUMIN: 3.8 g/dL (ref 3.5–5.0)
ALK PHOS: 83 U/L (ref 38–126)
ALT: 13 U/L — AB (ref 17–63)
AST: 18 U/L (ref 15–41)
Anion gap: 5 (ref 5–15)
BILIRUBIN TOTAL: 0.7 mg/dL (ref 0.3–1.2)
BUN: 24 mg/dL — ABNORMAL HIGH (ref 6–20)
CALCIUM: 8.6 mg/dL — AB (ref 8.9–10.3)
CO2: 27 mmol/L (ref 22–32)
CREATININE: 1.08 mg/dL (ref 0.61–1.24)
Chloride: 107 mmol/L (ref 101–111)
GFR calc non Af Amer: >60 mL/min (ref >60)
GLUCOSE: 96 mg/dL (ref 65–99)
Potassium: 4.7 mmol/L (ref 3.5–5.1)
SODIUM: 139 mmol/L (ref 135–145)
TOTAL PROTEIN: 6 g/dL — AB (ref 6.5–8.1)

## 2016-07-08 LAB — URINE MICROSCOPIC-ADD ON

## 2016-07-08 MED ORDER — RIVAROXABAN 10 MG PO TABS: 10.0000 mg | ORAL_TABLET | Freq: Every day | ORAL | 0 refills | Status: DC

## 2016-07-08 MED ORDER — RIVAROXABAN (XARELTO) EDUCATION KIT FOR DVT/PE PATIENTS: 1.0000 | PACK | Freq: Once | 0 refills | Status: AC

## 2016-07-08 NOTE — ED Provider Notes (Signed)
Cody Hudson Provider Note   CSN: [id number] Arrival date & time: 07/08/16  1106     History   Chief Complaint Chief Complaint  Patient presents with  . Leg Pain    HPI Cody Hudson is a 82 y.o. male.  Cody Hudson is a 82 y.o. Male who presents to the emergency department complaining of left lower leg pain and swelling onset 3 days ago. Patient reports he had a recent TURP by urologist Dr. Karsten Ro on 06/28/16. He reports some residual hematuria since the surgery but otherwise is feeling very well. He reports 3 days ago he noticed some pain and swelling to his medial left lower shin. He denies overlying skin changes. He denies history of DVT or PE. He is not on anticoagulants. He denies fevers, chest pain, shortness of breath, palpitations, numbness, tingling, weakness, or rashes.    The history is provided by the patient. No language interpreter was used.  Leg Pain   Pertinent negatives include no numbness.    Past Medical History:  Diagnosis Date  . Arthritis   . BPH (benign prostatic hyperplasia)   . Diverticulosis of colon   . Foley catheter in place   . Hematuria   . History of adenomatous polyp of colon    2011-- adenomatous;   09-12-2015  tubular adenoma  . Urinary retention   . Wears glasses     Patient Active Problem List   Diagnosis Date Noted  . BPH with urinary obstruction 06/27/2016  . RECTAL BLEEDING 06/24/2010  . COLONIC POLYPS, HX OF 06/24/2010    Past Surgical History:  Procedure Laterality Date  . COLONOSCOPY WITH PROPOFOL  last one 09-12-2015  . SHOULDER ARTHROSCOPY W/ SUBACROMIAL DECOMPRESSION AND DISTAL CLAVICLE EXCISION Left 11/05/2006  . SHOULDER ARTHROSCOPY WITH SUBACROMIAL DECOMPRESSION Right 06/16/2002  . TONSILLECTOMY AND ADENOIDECTOMY  child  . TRANSURETHRAL RESECTION OF PROSTATE N/A 06/27/2016   Procedure: TRANSURETHRAL RESECTION OF THE PROSTATE (TURP) WITH GYRUS: RMOVAL BLADDER STONE;  Surgeon: Kathie Rhodes, MD;  Location:  [facility];  Service: Urology;  Laterality: N/A;       Home Medications    Prior to Admission medications   Medication Sig Start Date End Date Taking? Authorizing Provider  oxyCODONE-acetaminophen (PERCOCET) 10-325 MG tablet Take 1-2 tablets by mouth every 4 (four) hours as needed for pain. Maximum dose per 24 hours - 8 pills Patient not taking: Reported on 07/08/2016 06/27/16   Kathie Rhodes, MD  phenazopyridine (PYRIDIUM) 200 MG tablet Take 1 tablet (200 mg total) by mouth 3 (three) times daily as needed for pain. Patient not taking: Reported on 07/08/2016 06/27/16   Kathie Rhodes, MD  rivaroxaban (XARELTO) 10 MG TABS tablet Take 1 tablet (10 mg total) by mouth daily. 07/08/16   Waynetta Pean, PA-C  rivaroxaban (XARELTO) KIT 1 kit by Does not apply route once. 07/08/16 07/08/16  Waynetta Pean, PA-C    Family History Family History  Problem Relation Age of Onset  . Colon cancer Father 25    Social History Social History  Substance Use Topics  . Smoking status: Never Smoker  . Smokeless tobacco: Never Used  . Alcohol use 0.6 oz/week    1 Shots of liquor per week     Allergies   Patient has no known allergies.   Review of Systems Review of Systems  Constitutional: Negative for chills and fever.  HENT: Negative for congestion and sore throat.   Eyes: Negative for visual disturbance.  Respiratory: Negative for  cough and shortness of breath.   Cardiovascular: Positive for leg swelling. Negative for chest pain and palpitations.  Gastrointestinal: Negative for abdominal pain, nausea and vomiting.  Genitourinary: Negative for dysuria.  Musculoskeletal: Positive for arthralgias. Negative for back pain and neck pain.  Skin: Negative for rash.  Neurological: Negative for syncope, weakness, numbness and headaches.     Physical Exam Updated Vital Signs BP 131/60   Pulse 60   Temp 98.4 F (36.9 C) (Oral)   Resp 16   SpO2 97%   Physical Exam    Constitutional: He appears well-developed and well-nourished. No distress.  HENT:  Head: Normocephalic and atraumatic.  Mouth/Throat: Oropharynx is clear and moist.  Eyes: Conjunctivae are normal. Pupils are equal, round, and reactive to light. Right eye exhibits no discharge. Left eye exhibits no discharge.  Neck: Neck supple.  Cardiovascular: Normal rate, regular rhythm, normal heart sounds and intact distal pulses.  Exam reveals no gallop and no friction rub.   No murmur heard. Bilateral radial, posterior tibialis and dorsalis pedis pulses are intact.    Pulmonary/Chest: Effort normal and breath sounds normal. No respiratory distress.  Abdominal: Soft. There is no tenderness.  Musculoskeletal: Normal range of motion. He exhibits edema and tenderness. He exhibits no deformity.  Left calf and ankle edema noted. Mild TTP to left medial calf. No palpable cords. Good ROM of his bilateral LE.   Lymphadenopathy:    He has no cervical adenopathy.  Neurological: He is alert. Coordination normal.  Sensation is intact his bilateral lower extremities.  Skin: Skin is warm and dry. Capillary refill takes less than 2 seconds. No rash noted. He is not diaphoretic. No erythema. No pallor.  No overlying skin changes to his bilateral lower extremities. no erythema, or warmth.  Psychiatric: He has a normal mood and affect. His behavior is normal.  Nursing note and vitals reviewed.    ED Treatments / Results  Labs (all labs ordered are listed, but only abnormal results are displayed) Labs Reviewed  COMPREHENSIVE METABOLIC PANEL - Abnormal; Notable for the following:       Result Value   BUN 24 (*)    Calcium 8.6 (*)    Total Protein 6.0 (*)    ALT 13 (*)    All other components within normal limits  CBC WITH DIFFERENTIAL/PLATELET - Abnormal; Notable for the following:    RBC 3.76 (*)    Hemoglobin 11.0 (*)    HCT 33.4 (*)    All other components within normal limits  URINALYSIS, ROUTINE W  REFLEX MICROSCOPIC (NOT AT [facility]) - Abnormal; Notable for the following:    Color, Urine AMBER (*)    APPearance CLOUDY (*)    Hgb urine dipstick LARGE (*)    Protein, ur 100 (*)    Leukocytes, UA SMALL (*)    All other components within normal limits  URINE MICROSCOPIC-ADD ON - Abnormal; Notable for the following:    Squamous Epithelial / LPF 0-5 (*)    Bacteria, UA MANY (*)    All other components within normal limits    EKG  EKG Interpretation None       Radiology No results found.  Procedures Procedures (including critical care time)  Medications Ordered in ED Medications - No data to display   Initial Impression / Assessment and Plan / ED Course  I have reviewed the triage vital signs and the nursing notes.  Pertinent labs & imaging results that were available during my care of  the patient were reviewed by me and considered in my medical decision making (see chart for details).  Clinical Course    This is a 82 y.o. Male who presents to the emergency department complaining of left lower leg pain and swelling onset 3 days ago. Patient reports he had a recent TURP by urologist Dr. Karsten Ro on 06/28/16. He reports some residual hematuria since the surgery but otherwise is feeling very well. He reports 3 days ago he noticed some pain and swelling to his medial left lower shin. He denies overlying skin changes. He denies history of DVT or PE. He is not on anticoagulants. On exam patient is afebrile nontoxic appearing. He has some mild edema and tenderness to the medial aspect of his right calf. No overlying skin changes. No evidence of cellulitis. He is neurovascularly intact. DVT study was obtained which showed evidence of the superficial thrombus of the greater saphenous vein from the ankle to the distal calf. Blood work here shows a hemoglobin of 11.0. Patient denies any symptoms of anemia such as shortness of breath or lightheadedness. Urinalysis still shows gross hematuria  with large hemoglobin. Patient with recent TURP on 06/28/2016. We would like to consider using anticoagulation therapy on this patient. I consulted with urologist Dr. Louis Meckel who reports that we can start anticoagulation therapy seven days after his TURP. He feels it is safe to start something such as Xarelto as the patient has close follow-up in three days. He also feels it's acceptable to wait the 3 days until follow-up on Friday. I discussed these results and plan with the patient. I discussed precautions when using Xarelto. I discussed the risks associated with anticoagulation. Patient reports he would like to wait until Friday for starting Xarelto. I feel this is an acceptable plan. Pharmacy also educated patient on Xarelto use and precautions. We'll discharge the patient and have him follow-up closely with his urologist this Friday as previously planned. Discussed strict and specific return precautions. I advised the patient to follow-up with their primary care provider this week. I advised the patient to return to the emergency department with new or worsening symptoms or new concerns. The patient verbalized understanding and agreement with plan.    This patient was discussed with and evaluated by Dr. Ellender Hose who agrees with assessment and plan.    Final Clinical Impressions(s) / ED Diagnoses   Final diagnoses:  Acute superficial venous thrombosis of left lower extremity    New Prescriptions Discharge Medication List as of 07/08/2016  2:41 PM    START taking these medications   Details  rivaroxaban (XARELTO) 10 MG TABS tablet Take 1 tablet (10 mg total) by mouth daily., Starting Tue 07/08/2016, Print    rivaroxaban (XARELTO) KIT 1 kit by Does not apply route once., Starting Tue 07/08/2016, Print         Waynetta Pean, PA-C 07/08/16 1556    Duffy Bruce, MD 07/09/16 1038

## 2016-07-08 NOTE — ED Notes (Signed)
Lab delay - Pt getting ultrasound

## 2016-07-08 NOTE — ED Triage Notes (Signed)
Pt c/o swelling and aching pain to left posterior calf x 3 days. Pt had prostate surgery on 06/27/16, residual hematuria from surgery. No SOB, CP.

## 2016-07-08 NOTE — ED Notes (Signed)
Patient discharged by charge RN.

## 2016-07-08 NOTE — Discharge Instructions (Signed)
Ultrasound today showed a superficial venous thromboembolism.

## 2016-07-08 NOTE — ED Notes (Signed)
Ultrasound at bedside

## 2016-07-08 NOTE — Progress Notes (Addendum)
VASCULAR LAB PRELIMINARY  PRELIMINARY  PRELIMINARY  PRELIMINARY  Left lower extremity venous duplex completed.    Preliminary report:  Left - No evidence of deep vein thrombosis or Baker's cyst. Positive for a superficial thrombus of the greater saphenous vein coursing from the ankle to distal calf and several branches.  Thalia Turkington, RVS 07/08/2016, 1:18 PM

## 2016-07-08 NOTE — ED Notes (Signed)
ED Provider at bedside. 

## 2016-07-11 DIAGNOSIS — N401 Enlarged prostate with lower urinary tract symptoms: Secondary | ICD-10-CM | POA: Diagnosis not present

## 2016-10-22 DIAGNOSIS — N400 Enlarged prostate without lower urinary tract symptoms: Secondary | ICD-10-CM | POA: Diagnosis not present

## 2016-12-24 DIAGNOSIS — Z79899 Other long term (current) drug therapy: Secondary | ICD-10-CM | POA: Diagnosis not present

## 2016-12-24 DIAGNOSIS — E784 Other hyperlipidemia: Secondary | ICD-10-CM | POA: Diagnosis not present

## 2017-01-13 DIAGNOSIS — K635 Polyp of colon: Secondary | ICD-10-CM | POA: Diagnosis not present

## 2017-01-13 DIAGNOSIS — R079 Chest pain, unspecified: Secondary | ICD-10-CM | POA: Diagnosis not present

## 2017-01-13 DIAGNOSIS — Z1212 Encounter for screening for malignant neoplasm of rectum: Secondary | ICD-10-CM | POA: Diagnosis not present

## 2017-01-13 DIAGNOSIS — Z1389 Encounter for screening for other disorder: Secondary | ICD-10-CM | POA: Diagnosis not present

## 2017-01-13 DIAGNOSIS — R972 Elevated prostate specific antigen [PSA]: Secondary | ICD-10-CM | POA: Diagnosis not present

## 2017-01-13 DIAGNOSIS — E784 Other hyperlipidemia: Secondary | ICD-10-CM | POA: Diagnosis not present

## 2017-01-13 DIAGNOSIS — Z0000 Encounter for general adult medical examination without abnormal findings: Secondary | ICD-10-CM | POA: Diagnosis not present

## 2017-01-13 DIAGNOSIS — Z6826 Body mass index (BMI) 26.0-26.9, adult: Secondary | ICD-10-CM | POA: Diagnosis not present

## 2017-02-16 DIAGNOSIS — H5213 Myopia, bilateral: Secondary | ICD-10-CM | POA: Diagnosis not present

## 2017-02-16 DIAGNOSIS — H2513 Age-related nuclear cataract, bilateral: Secondary | ICD-10-CM | POA: Diagnosis not present

## 2018-01-13 DIAGNOSIS — E7849 Other hyperlipidemia: Secondary | ICD-10-CM | POA: Diagnosis not present

## 2018-01-13 DIAGNOSIS — Z125 Encounter for screening for malignant neoplasm of prostate: Secondary | ICD-10-CM | POA: Diagnosis not present

## 2018-01-13 DIAGNOSIS — R7989 Other specified abnormal findings of blood chemistry: Secondary | ICD-10-CM | POA: Diagnosis not present

## 2018-01-13 DIAGNOSIS — R82998 Other abnormal findings in urine: Secondary | ICD-10-CM | POA: Diagnosis not present

## 2018-01-15 DIAGNOSIS — Z1212 Encounter for screening for malignant neoplasm of rectum: Secondary | ICD-10-CM | POA: Diagnosis not present

## 2018-01-25 DIAGNOSIS — Z1389 Encounter for screening for other disorder: Secondary | ICD-10-CM | POA: Diagnosis not present

## 2018-01-25 DIAGNOSIS — E7849 Other hyperlipidemia: Secondary | ICD-10-CM | POA: Diagnosis not present

## 2018-01-25 DIAGNOSIS — Z0000 Encounter for general adult medical examination without abnormal findings: Secondary | ICD-10-CM | POA: Diagnosis not present

## 2018-01-25 DIAGNOSIS — R0789 Other chest pain: Secondary | ICD-10-CM | POA: Diagnosis not present

## 2018-01-25 DIAGNOSIS — K635 Polyp of colon: Secondary | ICD-10-CM | POA: Diagnosis not present

## 2018-01-25 DIAGNOSIS — E663 Overweight: Secondary | ICD-10-CM | POA: Diagnosis not present

## 2018-01-25 DIAGNOSIS — R972 Elevated prostate specific antigen [PSA]: Secondary | ICD-10-CM | POA: Diagnosis not present

## 2018-01-25 DIAGNOSIS — Z23 Encounter for immunization: Secondary | ICD-10-CM | POA: Diagnosis not present

## 2018-01-25 DIAGNOSIS — Z125 Encounter for screening for malignant neoplasm of prostate: Secondary | ICD-10-CM | POA: Diagnosis not present

## 2018-01-25 DIAGNOSIS — Z6826 Body mass index (BMI) 26.0-26.9, adult: Secondary | ICD-10-CM | POA: Diagnosis not present

## 2018-01-25 DIAGNOSIS — M1990 Unspecified osteoarthritis, unspecified site: Secondary | ICD-10-CM | POA: Diagnosis not present

## 2018-01-25 DIAGNOSIS — Z87448 Personal history of other diseases of urinary system: Secondary | ICD-10-CM | POA: Diagnosis not present

## 2018-01-26 DIAGNOSIS — Z1212 Encounter for screening for malignant neoplasm of rectum: Secondary | ICD-10-CM | POA: Diagnosis not present

## 2018-02-11 DIAGNOSIS — H903 Sensorineural hearing loss, bilateral: Secondary | ICD-10-CM | POA: Diagnosis not present

## 2018-02-24 DIAGNOSIS — H11131 Conjunctival pigmentations, right eye: Secondary | ICD-10-CM | POA: Diagnosis not present

## 2018-02-24 DIAGNOSIS — H5213 Myopia, bilateral: Secondary | ICD-10-CM | POA: Diagnosis not present

## 2018-02-24 DIAGNOSIS — H2513 Age-related nuclear cataract, bilateral: Secondary | ICD-10-CM | POA: Diagnosis not present

## 2018-05-01 DIAGNOSIS — Z23 Encounter for immunization: Secondary | ICD-10-CM | POA: Diagnosis not present

## 2018-07-02 DIAGNOSIS — H903 Sensorineural hearing loss, bilateral: Secondary | ICD-10-CM | POA: Diagnosis not present

## 2018-07-28 DIAGNOSIS — E663 Overweight: Secondary | ICD-10-CM | POA: Diagnosis not present

## 2018-07-28 DIAGNOSIS — E7849 Other hyperlipidemia: Secondary | ICD-10-CM | POA: Diagnosis not present

## 2018-07-28 DIAGNOSIS — Z6827 Body mass index (BMI) 27.0-27.9, adult: Secondary | ICD-10-CM | POA: Diagnosis not present

## 2018-07-28 DIAGNOSIS — M545 Low back pain: Secondary | ICD-10-CM | POA: Diagnosis not present

## 2019-03-14 DIAGNOSIS — R82998 Other abnormal findings in urine: Secondary | ICD-10-CM | POA: Diagnosis not present

## 2019-03-14 DIAGNOSIS — E7849 Other hyperlipidemia: Secondary | ICD-10-CM | POA: Diagnosis not present

## 2019-03-14 DIAGNOSIS — Z125 Encounter for screening for malignant neoplasm of prostate: Secondary | ICD-10-CM | POA: Diagnosis not present

## 2019-03-21 DIAGNOSIS — N62 Hypertrophy of breast: Secondary | ICD-10-CM | POA: Diagnosis not present

## 2019-03-21 DIAGNOSIS — E663 Overweight: Secondary | ICD-10-CM | POA: Diagnosis not present

## 2019-03-21 DIAGNOSIS — M545 Low back pain: Secondary | ICD-10-CM | POA: Diagnosis not present

## 2019-03-21 DIAGNOSIS — R079 Chest pain, unspecified: Secondary | ICD-10-CM | POA: Diagnosis not present

## 2019-03-21 DIAGNOSIS — R972 Elevated prostate specific antigen [PSA]: Secondary | ICD-10-CM | POA: Diagnosis not present

## 2019-03-21 DIAGNOSIS — K635 Polyp of colon: Secondary | ICD-10-CM | POA: Diagnosis not present

## 2019-03-21 DIAGNOSIS — E785 Hyperlipidemia, unspecified: Secondary | ICD-10-CM | POA: Diagnosis not present

## 2019-03-21 DIAGNOSIS — Z0000 Encounter for general adult medical examination without abnormal findings: Secondary | ICD-10-CM | POA: Diagnosis not present

## 2019-03-21 DIAGNOSIS — Z87448 Personal history of other diseases of urinary system: Secondary | ICD-10-CM | POA: Diagnosis not present

## 2019-03-21 DIAGNOSIS — M1990 Unspecified osteoarthritis, unspecified site: Secondary | ICD-10-CM | POA: Diagnosis not present

## 2019-04-21 DIAGNOSIS — Z23 Encounter for immunization: Secondary | ICD-10-CM | POA: Diagnosis not present

## 2019-09-21 DIAGNOSIS — R69 Illness, unspecified: Secondary | ICD-10-CM | POA: Diagnosis not present

## 2019-09-23 ENCOUNTER — Ambulatory Visit: Payer: PPO

## 2019-09-26 DIAGNOSIS — K5900 Constipation, unspecified: Secondary | ICD-10-CM | POA: Diagnosis not present

## 2019-09-26 DIAGNOSIS — Z809 Family history of malignant neoplasm, unspecified: Secondary | ICD-10-CM | POA: Diagnosis not present

## 2019-09-26 DIAGNOSIS — N3281 Overactive bladder: Secondary | ICD-10-CM | POA: Diagnosis not present

## 2019-09-26 DIAGNOSIS — N529 Male erectile dysfunction, unspecified: Secondary | ICD-10-CM | POA: Diagnosis not present

## 2019-09-29 ENCOUNTER — Ambulatory Visit: Payer: Medicare HMO | Attending: Internal Medicine

## 2019-09-29 DIAGNOSIS — Z23 Encounter for immunization: Secondary | ICD-10-CM | POA: Insufficient documentation

## 2019-09-29 NOTE — Progress Notes (Signed)
   Covid-19 Vaccination Clinic  Name:  Cody Hudson    MRN: [id number] DOB: 07-Jul-1934  09/29/2019  Mr. Milnes was observed post Covid-19 immunization for 15 minutes without incidence. He was provided with Vaccine Information Sheet and instruction to access the V-Safe system.   Mr. Gassett was instructed to call 911 with any severe reactions post vaccine: Marland Kitchen Difficulty breathing  . Swelling of your face and throat  . A fast heartbeat  . A bad rash all over your body  . Dizziness and weakness    Immunizations Administered    Name Date Dose VIS Date Route   Pfizer COVID-19 Vaccine 09/29/2019  9:09 AM 0.3 mL 08/05/2019 Intramuscular   Manufacturer: [employer], [employer]   Lot: [id number]   NDC: [address]-9

## 2019-10-24 ENCOUNTER — Ambulatory Visit: Payer: Medicare HMO | Attending: Internal Medicine

## 2019-10-24 DIAGNOSIS — Z23 Encounter for immunization: Secondary | ICD-10-CM | POA: Insufficient documentation

## 2019-10-24 NOTE — Progress Notes (Signed)
   Covid-19 Vaccination Clinic  Name:  Cody Hudson    MRN: [id number] DOB: Jul 21, 1934  10/24/2019  Mr. Back was observed post Covid-19 immunization for 15 minutes without incidence. He was provided with Vaccine Information Sheet and instruction to access the V-Safe system.   [name] was instructed to call 911 with any severe reactions post vaccine: Marland Kitchen Difficulty breathing  . Swelling of your face and throat  . A fast heartbeat  . A bad rash all over your body  . Dizziness and weakness    Immunizations Administered    Name Date Dose VIS Date Route   Pfizer COVID-19 Vaccine 10/24/2019  1:20 PM 0.3 mL 08/05/2019 Intramuscular   Manufacturer: [employer], [employer]   Lot: [id number]   NDC: [address]-8

## 2019-11-22 DIAGNOSIS — R69 Illness, unspecified: Secondary | ICD-10-CM | POA: Diagnosis not present

## 2019-11-23 DIAGNOSIS — R69 Illness, unspecified: Secondary | ICD-10-CM | POA: Diagnosis not present

## 2020-03-15 DIAGNOSIS — Z125 Encounter for screening for malignant neoplasm of prostate: Secondary | ICD-10-CM | POA: Diagnosis not present

## 2020-03-15 DIAGNOSIS — Z0000 Encounter for general adult medical examination without abnormal findings: Secondary | ICD-10-CM | POA: Diagnosis not present

## 2020-03-15 DIAGNOSIS — E7849 Other hyperlipidemia: Secondary | ICD-10-CM | POA: Diagnosis not present

## 2020-03-28 DIAGNOSIS — K429 Umbilical hernia without obstruction or gangrene: Secondary | ICD-10-CM | POA: Diagnosis not present

## 2020-03-28 DIAGNOSIS — Z0000 Encounter for general adult medical examination without abnormal findings: Secondary | ICD-10-CM | POA: Diagnosis not present

## 2020-03-28 DIAGNOSIS — E785 Hyperlipidemia, unspecified: Secondary | ICD-10-CM | POA: Diagnosis not present

## 2020-03-28 DIAGNOSIS — R82998 Other abnormal findings in urine: Secondary | ICD-10-CM | POA: Diagnosis not present

## 2020-03-28 DIAGNOSIS — R972 Elevated prostate specific antigen [PSA]: Secondary | ICD-10-CM | POA: Diagnosis not present

## 2020-03-28 DIAGNOSIS — M545 Low back pain: Secondary | ICD-10-CM | POA: Diagnosis not present

## 2020-03-28 DIAGNOSIS — M1990 Unspecified osteoarthritis, unspecified site: Secondary | ICD-10-CM | POA: Diagnosis not present

## 2020-03-28 DIAGNOSIS — E663 Overweight: Secondary | ICD-10-CM | POA: Diagnosis not present

## 2020-03-29 DIAGNOSIS — Z1212 Encounter for screening for malignant neoplasm of rectum: Secondary | ICD-10-CM | POA: Diagnosis not present

## 2020-05-16 DIAGNOSIS — R69 Illness, unspecified: Secondary | ICD-10-CM | POA: Diagnosis not present

## 2020-10-03 DIAGNOSIS — E785 Hyperlipidemia, unspecified: Secondary | ICD-10-CM | POA: Diagnosis not present

## 2020-10-03 DIAGNOSIS — E663 Overweight: Secondary | ICD-10-CM | POA: Diagnosis not present

## 2020-10-03 DIAGNOSIS — R972 Elevated prostate specific antigen [PSA]: Secondary | ICD-10-CM | POA: Diagnosis not present

## 2020-11-24 DIAGNOSIS — R69 Illness, unspecified: Secondary | ICD-10-CM | POA: Diagnosis not present

## 2020-11-24 DIAGNOSIS — Z8249 Family history of ischemic heart disease and other diseases of the circulatory system: Secondary | ICD-10-CM | POA: Diagnosis not present

## 2020-11-24 DIAGNOSIS — E663 Overweight: Secondary | ICD-10-CM | POA: Diagnosis not present

## 2020-11-24 DIAGNOSIS — Z6826 Body mass index (BMI) 26.0-26.9, adult: Secondary | ICD-10-CM | POA: Diagnosis not present

## 2020-11-24 DIAGNOSIS — N529 Male erectile dysfunction, unspecified: Secondary | ICD-10-CM | POA: Diagnosis not present

## 2020-11-24 DIAGNOSIS — Z825 Family history of asthma and other chronic lower respiratory diseases: Secondary | ICD-10-CM | POA: Diagnosis not present

## 2021-03-12 DIAGNOSIS — H11131 Conjunctival pigmentations, right eye: Secondary | ICD-10-CM | POA: Diagnosis not present

## 2021-03-12 DIAGNOSIS — H2513 Age-related nuclear cataract, bilateral: Secondary | ICD-10-CM | POA: Diagnosis not present

## 2021-03-12 DIAGNOSIS — H5213 Myopia, bilateral: Secondary | ICD-10-CM | POA: Diagnosis not present

## 2021-03-27 DIAGNOSIS — Z125 Encounter for screening for malignant neoplasm of prostate: Secondary | ICD-10-CM | POA: Diagnosis not present

## 2021-03-27 DIAGNOSIS — E785 Hyperlipidemia, unspecified: Secondary | ICD-10-CM | POA: Diagnosis not present

## 2021-03-28 DIAGNOSIS — H25811 Combined forms of age-related cataract, right eye: Secondary | ICD-10-CM | POA: Diagnosis not present

## 2021-03-28 DIAGNOSIS — H2511 Age-related nuclear cataract, right eye: Secondary | ICD-10-CM | POA: Diagnosis not present

## 2021-04-03 DIAGNOSIS — Z1331 Encounter for screening for depression: Secondary | ICD-10-CM | POA: Diagnosis not present

## 2021-04-03 DIAGNOSIS — Z0000 Encounter for general adult medical examination without abnormal findings: Secondary | ICD-10-CM | POA: Diagnosis not present

## 2021-04-03 DIAGNOSIS — Z1339 Encounter for screening examination for other mental health and behavioral disorders: Secondary | ICD-10-CM | POA: Diagnosis not present

## 2021-04-03 DIAGNOSIS — E785 Hyperlipidemia, unspecified: Secondary | ICD-10-CM | POA: Diagnosis not present

## 2021-04-03 DIAGNOSIS — R82998 Other abnormal findings in urine: Secondary | ICD-10-CM | POA: Diagnosis not present

## 2021-04-11 DIAGNOSIS — H2512 Age-related nuclear cataract, left eye: Secondary | ICD-10-CM | POA: Diagnosis not present

## 2021-04-11 DIAGNOSIS — H25812 Combined forms of age-related cataract, left eye: Secondary | ICD-10-CM | POA: Diagnosis not present

## 2021-07-04 DIAGNOSIS — Z0100 Encounter for examination of eyes and vision without abnormal findings: Secondary | ICD-10-CM | POA: Diagnosis not present

## 2021-09-30 DIAGNOSIS — K625 Hemorrhage of anus and rectum: Secondary | ICD-10-CM | POA: Diagnosis not present

## 2021-09-30 DIAGNOSIS — R972 Elevated prostate specific antigen [PSA]: Secondary | ICD-10-CM | POA: Diagnosis not present

## 2021-11-12 DIAGNOSIS — R079 Chest pain, unspecified: Secondary | ICD-10-CM | POA: Diagnosis not present

## 2021-11-12 DIAGNOSIS — R001 Bradycardia, unspecified: Secondary | ICD-10-CM | POA: Diagnosis not present

## 2021-11-13 ENCOUNTER — Other Ambulatory Visit: Payer: Self-pay

## 2021-11-13 ENCOUNTER — Encounter: Payer: Self-pay | Admitting: Cardiology

## 2021-11-13 ENCOUNTER — Ambulatory Visit: Payer: Medicare HMO | Admitting: Cardiology

## 2021-11-13 VITALS — BP 115/58 | HR 46 | Temp 98.0°F | Resp 16 | Ht 75.0 in | Wt 213.0 lb

## 2021-11-13 DIAGNOSIS — R072 Precordial pain: Secondary | ICD-10-CM

## 2021-11-13 DIAGNOSIS — I499 Cardiac arrhythmia, unspecified: Secondary | ICD-10-CM

## 2021-11-13 MED ORDER — ASPIRIN EC 81 MG PO TBEC: 81.0000 mg | DELAYED_RELEASE_TABLET | Freq: Every day | ORAL | 3 refills | Status: DC

## 2021-11-13 MED ORDER — NITROGLYCERIN 0.4 MG SL SUBL: 0.4000 mg | SUBLINGUAL_TABLET | SUBLINGUAL | 3 refills | Status: DC | PRN

## 2021-11-13 NOTE — Progress Notes (Signed)
? ? ?Patient referred by Burnard Bunting, MD for chest pain ? ?Subjective:  ? ?Cody Hudson, male    DOB: 1934/05/28, 88 y.o.   MRN: [id number] ? ? ?Chief Complaint  ?Patient presents with  ? Chest Pain  ? New Patient (Initial Visit)  ? ? ? ?HPI ? ?88 y.o. Caucasian male with chest pain ? ?Patient is a highly active 88 year old gentleman.  He retired after [employer].  Since retirement, he has now been substituting as a Pharmacist, hospital couple times a week.  He has been athletic all his life, and ran until 4 years ago.  He continues to walk, and walked up to 10,000 steps on trails as recently as 2 weeks ago.  For last 3 days, he is experienced episodes of chest discomfort, which she describes as "gas-like" sensation.  This seems to be present off and on throughout the day.  It does seem to get worse with deep breathing, as well as walking inside the house, improves with rest.  Worsening episodes only last for few seconds. ? ?Patient reports that 20 years ago, he had an episode of dehydration and lightheadedness while working on the roof.  At that time, he was seen by a cardiologist and was found to have atrial fibrillation.  His atrial fibrillation was attributed to his dehydration.  He has never been on anticoagulation. He has not had recurrent symptoms of similar nature ever since then. ? ? ?Past Medical History:  ?Diagnosis Date  ? Arthritis   ? BPH (benign prostatic hyperplasia)   ? Diverticulosis of colon   ? Foley catheter in place   ? Hematuria   ? History of adenomatous polyp of colon   ? 2011-- adenomatous;   09-12-2015  tubular adenoma  ? Urinary retention   ? Wears glasses   ? ? ? ?Past Surgical History:  ?Procedure Laterality Date  ? COLONOSCOPY WITH PROPOFOL  last one 09-12-2015  ? SHOULDER ARTHROSCOPY W/ SUBACROMIAL DECOMPRESSION AND DISTAL CLAVICLE EXCISION Left 11/05/2006  ? SHOULDER ARTHROSCOPY WITH SUBACROMIAL DECOMPRESSION Right 06/16/2002  ? TONSILLECTOMY AND ADENOIDECTOMY  child  ?  TRANSURETHRAL RESECTION OF PROSTATE N/A 06/27/2016  ? Procedure: TRANSURETHRAL RESECTION OF THE PROSTATE (TURP) WITH GYRUS: RMOVAL BLADDER STONE;  Surgeon: Kathie Rhodes, MD;  Location: [facility];  Service: Urology;  Laterality: N/A;  ? ? ? ?Social History  ? ?Tobacco Use  ?Smoking Status Never  ?Smokeless Tobacco Never  ? ? ?Social History  ? ?Substance and Sexual Activity  ?Alcohol Use Yes  ? Alcohol/week: 1.0 standard drink  ? Types: 1 Shots of liquor per week  ? Comment: O[facility]  ? ? ? ?Family History  ?Problem Relation Age of Onset  ? Colon cancer Father 73  ? ? ? ? ?Current Outpatient Medications:  ?  Probiotic Product (PROBIOTIC ACIDOPHILUS BIOBEADS PO), Take by mouth., Disp: , Rfl:  ? ? ?Cardiovascular and other pertinent studies: ? ?Reviewed external labs and tests, independently interpreted ? ?EKG 11/13/2021: ?Sinus rhythm with significant rate variation s/o sinus arhrtymia ?Cannot exclude sinoatrial exit block ?Frequent PACs  ?No ischemic changes ? ? ?Recent labs: ?09/30/2021: ?Glucose 99, BUN/Cr 27/1.0. EGFR 71. Na/K 142/5.1. Rest of the CMP normal ?H/H 14/42. MCV 85. Platelets 149 ?HbA1C NA ?Chol 178, TG 114, HDL 40, LDL 115 ?TSH 2.0 normal ? ? ? ?Review of Systems  ?Cardiovascular:  Positive for chest pain. Negative for dyspnea on exertion, leg swelling, palpitations and syncope.  ? ?   ? ? ?  Vitals:  ? 11/13/21 0829  ?BP: (!) 115/58  ?Pulse: (!) 46  ?Resp: 16  ?Temp: 98 ?F (36.7 ?C)  ?SpO2: 100%  ? ? ? ?Body mass index is 26.62 kg/m?. Danley Danker Weights  ? 11/13/21 0829  ?Weight: 213 lb (96.6 kg)  ? ? ? ?Objective:  ? Physical Exam ?Vitals and nursing note reviewed.  ?Constitutional:   ?   General: He is not in acute distress. ?Neck:  ?   Vascular: No JVD.  ?Cardiovascular:  ?   Rate and Rhythm: Normal rate and regular rhythm.  ?   Heart sounds: Normal heart sounds. No murmur heard. ?Pulmonary:  ?   Effort: Pulmonary effort is normal.  ?   Breath sounds: Normal breath sounds. No wheezing or rales.   ?Musculoskeletal:  ?   Right lower leg: No edema.  ?   Left lower leg: No edema.  ? ? ? ?   ? ?Visit diagnoses: ?  ICD-10-CM   ?1. Precordial pain  R07.2 EKG 12-Lead  ?  PCV MYOCARDIAL PERFUSION WO LEXISCAN  ?  PCV ECHOCARDIOGRAM COMPLETE  ?  LONG TERM MONITOR (3-14 DAYS)  ?  CT CARDIAC SCORING ([facility] LOCATIONS ONLY)  ?  aspirin EC 81 MG tablet  ?  nitroGLYCERIN (NITROSTAT) 0.4 MG SL tablet  ?  ?2. Irregular heart beat  I49.9 LONG TERM MONITOR (3-14 DAYS)  ?  ?  ? ?Orders Placed This Encounter  ?Procedures  ? EKG 12-Lead  ?  ? ?Medication changes this visit: ?Medications Discontinued During This Encounter  ?Medication Reason  ? oxyCODONE-acetaminophen (PERCOCET) 10-325 MG tablet   ? phenazopyridine (PYRIDIUM) 200 MG tablet   ? rivaroxaban (XARELTO) 10 MG TABS tablet   ?  ?No orders of the defined types were placed in this encounter. ?  ? ?Assessment & Recommendations:  ? ?88 y.o. Caucasian male with chest pain ? ?Chest pain: ?Features of both typical and atypical angina.  Resting EKG without any ischemia. ?Recommend exercise nuclear stress test, echocardiogram, CT cardiac scoring. ?Recommend Aspirin 81 mg, nitroglycerin prn ? ?Abnormal EKG: ?Significant rate variation with sinus arrhythmia, cannot exclude sinoatrial exit block. ?Fortunately, patient has absolutely no symptoms related to arrhythmia. ?Remote history of A-fib, not on anticoagulation. ?Recommend 1 week cardiac telemetry. ? ?Further recommendations after above testing ? ? ?Thank you for referring the patient to [place]. Please feel free to contact with any questions. ? ? ?Nigel Mormon, MD ?Pager: [phone] ?Office: [phone] ?

## 2021-11-14 ENCOUNTER — Other Ambulatory Visit: Payer: Self-pay

## 2021-11-14 DIAGNOSIS — R072 Precordial pain: Secondary | ICD-10-CM

## 2021-11-14 MED ORDER — NITROGLYCERIN 0.4 MG SL SUBL: 0.4000 mg | SUBLINGUAL_TABLET | SUBLINGUAL | 3 refills | Status: AC | PRN

## 2021-11-19 ENCOUNTER — Inpatient Hospital Stay: Payer: Medicare HMO

## 2021-11-19 ENCOUNTER — Ambulatory Visit: Payer: Medicare HMO

## 2021-11-19 DIAGNOSIS — R072 Precordial pain: Secondary | ICD-10-CM | POA: Diagnosis not present

## 2021-11-19 DIAGNOSIS — I341 Nonrheumatic mitral (valve) prolapse: Secondary | ICD-10-CM

## 2021-11-20 ENCOUNTER — Other Ambulatory Visit: Payer: Self-pay

## 2021-11-26 ENCOUNTER — Inpatient Hospital Stay: Payer: Medicare HMO

## 2021-11-26 ENCOUNTER — Ambulatory Visit: Payer: Medicare HMO

## 2021-11-26 DIAGNOSIS — R072 Precordial pain: Secondary | ICD-10-CM | POA: Diagnosis not present

## 2021-11-26 DIAGNOSIS — I499 Cardiac arrhythmia, unspecified: Secondary | ICD-10-CM | POA: Diagnosis not present

## 2021-12-02 ENCOUNTER — Ambulatory Visit: Payer: Medicare HMO | Admitting: Cardiology

## 2021-12-06 ENCOUNTER — Ambulatory Visit: Payer: Medicare HMO | Admitting: Cardiology

## 2021-12-11 ENCOUNTER — Encounter: Payer: Self-pay | Admitting: Cardiology

## 2021-12-11 ENCOUNTER — Ambulatory Visit: Payer: Medicare HMO | Admitting: Cardiology

## 2021-12-11 VITALS — BP 128/71 | HR 54 | Temp 98.0°F | Resp 16 | Ht 75.0 in | Wt 212.0 lb

## 2021-12-11 DIAGNOSIS — R072 Precordial pain: Secondary | ICD-10-CM | POA: Insufficient documentation

## 2021-12-11 DIAGNOSIS — I499 Cardiac arrhythmia, unspecified: Secondary | ICD-10-CM | POA: Diagnosis not present

## 2021-12-11 NOTE — Progress Notes (Signed)
? ? ?Patient referred by Burnard Bunting, MD for chest pain ? ?Subjective:  ? ?Cody Hudson, male    DOB: 03/16/1934, 88 y.o.   MRN: [id number] ? ? ?Chief Complaint  ?Patient presents with  ? Chest Pain  ? Follow-up  ? Results  ? ? ? ?HPI ? ?88 y.o. Caucasian male with chest pain ? ?Cardiac monitor results pending.  No ischemia on stress testing.  Patient has not had any recurrent chest pain symptoms. ? ?Initial consultation 10/2021: ?Patient is a highly active 88 year old gentleman.  He retired after [employer].  Since retirement, he has now been substituting as a Pharmacist, hospital couple times a week.  He has been athletic all his life, and ran until 4 years ago.  He continues to walk, and walked up to 10,000 steps on trails as recently as 2 weeks ago.  For last 3 days, he is experienced episodes of chest discomfort, which she describes as "gas-like" sensation.  This seems to be present off and on throughout the day.  It does seem to get worse with deep breathing, as well as walking inside the house, improves with rest.  Worsening episodes only last for few seconds. ? ?Patient reports that 20 years ago, he had an episode of dehydration and lightheadedness while working on the roof.  At that time, he was seen by a cardiologist and was found to have atrial fibrillation.  His atrial fibrillation was attributed to his dehydration.  He has never been on anticoagulation. He has not had recurrent symptoms of similar nature ever since then. ? ? ?Current Outpatient Medications:  ?  aspirin EC 81 MG tablet, Take 1 tablet (81 mg total) by mouth daily. Swallow whole., Disp: 90 tablet, Rfl: 3 ?  nitroGLYCERIN (NITROSTAT) 0.4 MG SL tablet, Place 1 tablet (0.4 mg total) under the tongue every 5 (five) minutes as needed for chest pain., Disp: 90 tablet, Rfl: 3 ?  Probiotic Product (PROBIOTIC ACIDOPHILUS BIOBEADS PO), Take by mouth., Disp: , Rfl:  ? ? ?Cardiovascular and other pertinent studies: ? ?Reviewed external labs  and tests, independently interpreted ? ?Exercise nuclear stress test 11/26/2021: ?Normal ECG stress. The patient exercised for 4 minutes and 58 seconds of a Bruce protocol, achieving approximately 6.99 METs. The blood pressure response was normal. ?Myocardial perfusion is normal. Overall LV systolic function is normal without regional wall motion abnormalities. Stress LV EF: 51%.  ?No previous exam available for comparison. Low risk.   ? ?Echocardiogram 11/19/2021:  ?Normal LV systolic function with EF 61%. Left ventricle cavity is normal  ?in size. Normal global wall motion. Doppler evidence of grade I (impaired)  ?diastolic dysfunction, normal LAP. Calculated EF 61%.  ?Mild prolapse of the mitral valve leaflets. No significant myxomatous  ?degeneration. Mild to moderate mitral regurgitation.  ?Structurally normal tricuspid valve.  Mild tricuspid regurgitation. No  ?evidence of pulmonary hypertension.  ?Structurally normal pulmonic valve.  Mild pulmonic regurgitation. ? ?EKG 11/13/2021: ?Sinus rhythm with significant rate variation s/o sinus arhrtymia ?Cannot exclude sinoatrial exit block ?Frequent PACs  ?No ischemic changes ? ? ?Recent labs: ?09/30/2021: ?Glucose 99, BUN/Cr 27/1.0. EGFR 71. Na/K 142/5.1. Rest of the CMP normal ?H/H 14/42. MCV 85. Platelets 149 ?HbA1C NA ?Chol 178, TG 114, HDL 40, LDL 115 ?TSH 2.0 normal ? ? ? ?Review of Systems  ?Cardiovascular:  Positive for chest pain. Negative for dyspnea on exertion, leg swelling, palpitations and syncope.  ? ?   ? ? ?Vitals:  ? 12/11/21 1054 12/11/21 1100  ?  BP: (!) 131/92 128/71  ?Pulse: (!) 51 (!) 54  ?Resp: 16   ?Temp: 98 ?F (36.7 ?C)   ?SpO2: 97%   ? ? ? ?Body mass index is 26.5 kg/m?. ?Filed Weights  ? 12/11/21 1054  ?Weight: 212 lb (96.2 kg)  ? ? ? ?Objective:  ? Physical Exam ?Vitals and nursing note reviewed.  ?Constitutional:   ?   General: He is not in acute distress. ?Neck:  ?   Vascular: No JVD.  ?Cardiovascular:  ?   Rate and Rhythm: Normal rate and  regular rhythm.  ?   Heart sounds: Normal heart sounds. No murmur heard. ?Pulmonary:  ?   Effort: Pulmonary effort is normal.  ?   Breath sounds: Normal breath sounds. No wheezing or rales.  ?Musculoskeletal:  ?   Right lower leg: No edema.  ?   Left lower leg: No edema.  ? ? ? ?   ? ?Visit diagnoses: ?No diagnosis found. ?  ? ?No orders of the defined types were placed in this encounter. ?  ? ?Medication changes this visit: ?There are no discontinued medications. ?  ?No orders of the defined types were placed in this encounter. ?  ? ?Assessment & Recommendations:  ? ?88 y.o. Caucasian male with chest pain ? ?Chest pain: ?Features of both typical and atypical angina.  Resting EKG without any ischemia. ?Exercise nuclear stress test without ischemia, structurally normal heart on echocardiogram. ?Reasonable to omit CT cardiac scoring at this time.   ? ?Abnormal EKG: ?Significant rate variation with sinus arrhythmia, cannot exclude sinoatrial exit block. ?Fortunately, patient has absolutely no symptoms related to arrhythmia. ?Remote history of A-fib, not on anticoagulation. ?Cardiac telemetry results pending. ? ? ?F/u in 3 months ? ? ?Nigel Mormon, MD ?Pager: [phone] ?Office: [phone] ?

## 2021-12-19 DIAGNOSIS — I499 Cardiac arrhythmia, unspecified: Secondary | ICD-10-CM | POA: Diagnosis not present

## 2021-12-19 DIAGNOSIS — R072 Precordial pain: Secondary | ICD-10-CM | POA: Diagnosis not present

## 2021-12-24 ENCOUNTER — Ambulatory Visit
Admission: RE | Admit: 2021-12-24 | Discharge: 2021-12-24 | Disposition: A | Discharge status: Home / Self Care | Payer: No Typology Code available for payment source | Source: Ambulatory Visit | Attending: Cardiology | Admitting: Cardiology

## 2021-12-24 DIAGNOSIS — R072 Precordial pain: Secondary | ICD-10-CM

## 2021-12-24 NOTE — Progress Notes (Signed)
Mild amount of calcium. Recommend low dose statin Crestor 10 mg to reduce cardiac risk. If patient agreeable, please send 90 pillsX3 refills. ? ?Thanks ?MJP ? ?

## 2021-12-26 NOTE — Progress Notes (Signed)
Called pt no answer, left a vm

## 2021-12-27 NOTE — Progress Notes (Signed)
Tried calling patient no answer left a vm

## 2021-12-27 NOTE — Progress Notes (Signed)
Pt called back and would like to see the result first before tking medication. Mailed pt results

## 2022-01-11 DIAGNOSIS — R072 Precordial pain: Secondary | ICD-10-CM | POA: Diagnosis not present

## 2022-01-11 DIAGNOSIS — I499 Cardiac arrhythmia, unspecified: Secondary | ICD-10-CM | POA: Diagnosis not present

## 2022-02-11 DIAGNOSIS — Z008 Encounter for other general examination: Secondary | ICD-10-CM | POA: Diagnosis not present

## 2022-02-11 DIAGNOSIS — Z820 Family history of epilepsy and other diseases of the nervous system: Secondary | ICD-10-CM | POA: Diagnosis not present

## 2022-02-11 DIAGNOSIS — R030 Elevated blood-pressure reading, without diagnosis of hypertension: Secondary | ICD-10-CM | POA: Diagnosis not present

## 2022-02-11 DIAGNOSIS — Z809 Family history of malignant neoplasm, unspecified: Secondary | ICD-10-CM | POA: Diagnosis not present

## 2022-02-11 DIAGNOSIS — K5900 Constipation, unspecified: Secondary | ICD-10-CM | POA: Diagnosis not present

## 2022-02-11 DIAGNOSIS — N529 Male erectile dysfunction, unspecified: Secondary | ICD-10-CM | POA: Diagnosis not present

## 2022-02-11 DIAGNOSIS — Z96649 Presence of unspecified artificial hip joint: Secondary | ICD-10-CM | POA: Diagnosis not present

## 2022-02-26 DIAGNOSIS — R413 Other amnesia: Secondary | ICD-10-CM | POA: Diagnosis not present

## 2022-03-25 DIAGNOSIS — H52203 Unspecified astigmatism, bilateral: Secondary | ICD-10-CM | POA: Diagnosis not present

## 2022-03-25 DIAGNOSIS — Z961 Presence of intraocular lens: Secondary | ICD-10-CM | POA: Diagnosis not present

## 2022-03-31 DIAGNOSIS — R82998 Other abnormal findings in urine: Secondary | ICD-10-CM | POA: Diagnosis not present

## 2022-03-31 DIAGNOSIS — Z0000 Encounter for general adult medical examination without abnormal findings: Secondary | ICD-10-CM | POA: Diagnosis not present

## 2022-03-31 DIAGNOSIS — E785 Hyperlipidemia, unspecified: Secondary | ICD-10-CM | POA: Diagnosis not present

## 2022-03-31 DIAGNOSIS — R7989 Other specified abnormal findings of blood chemistry: Secondary | ICD-10-CM | POA: Diagnosis not present

## 2022-03-31 DIAGNOSIS — Z125 Encounter for screening for malignant neoplasm of prostate: Secondary | ICD-10-CM | POA: Diagnosis not present

## 2022-04-07 DIAGNOSIS — R413 Other amnesia: Secondary | ICD-10-CM | POA: Diagnosis not present

## 2022-04-07 DIAGNOSIS — Z1339 Encounter for screening examination for other mental health and behavioral disorders: Secondary | ICD-10-CM | POA: Diagnosis not present

## 2022-04-07 DIAGNOSIS — E785 Hyperlipidemia, unspecified: Secondary | ICD-10-CM | POA: Diagnosis not present

## 2022-04-07 DIAGNOSIS — Z0000 Encounter for general adult medical examination without abnormal findings: Secondary | ICD-10-CM | POA: Diagnosis not present

## 2022-04-07 DIAGNOSIS — Z1331 Encounter for screening for depression: Secondary | ICD-10-CM | POA: Diagnosis not present

## 2022-04-09 ENCOUNTER — Ambulatory Visit: Payer: Medicare HMO | Admitting: Cardiology

## 2022-04-09 ENCOUNTER — Encounter: Payer: Self-pay | Admitting: Cardiology

## 2022-04-09 VITALS — BP 146/66 | HR 60 | Temp 98.0°F | Resp 16 | Ht 75.0 in | Wt 217.0 lb

## 2022-04-09 DIAGNOSIS — R030 Elevated blood-pressure reading, without diagnosis of hypertension: Secondary | ICD-10-CM

## 2022-04-09 DIAGNOSIS — I471 Supraventricular tachycardia: Secondary | ICD-10-CM | POA: Diagnosis not present

## 2022-04-09 DIAGNOSIS — I480 Paroxysmal atrial fibrillation: Secondary | ICD-10-CM | POA: Diagnosis not present

## 2022-04-09 NOTE — Progress Notes (Signed)
Patient referred by Burnard Bunting, MD for chest pain  Subjective:   Cody Hudson, male    DOB: [birth year], 88 y.o.   MRN: [id number]   Chief Complaint  Patient presents with   Chest Pain   Follow-up   Atrial Fibrillation     HPI  88 y.o. Caucasian male with chest pain  Since last visit, patient has not had any recurrent chest pain episodes.  He continues to walk 7-8 miles most days, without any complaints of symptoms.  He also denies any palpitation symptoms.  Blood pressure slightly elevated today.  Reviewed recent test results with the patient, details below.   Initial consultation 10/2021: Patient is a highly active 88 year old gentleman.  He retired after [employer].  Since retirement, he has now been substituting as a Pharmacist, hospital couple times a week.  He has been athletic all his life, and ran until 4 years ago.  He continues to walk, and walked up to 10,000 steps on trails as recently as 2 weeks ago.  For last 3 days, he is experienced episodes of chest discomfort, which she describes as "gas-like" sensation.  This seems to be present off and on throughout the day.  It does seem to get worse with deep breathing, as well as walking inside the house, improves with rest.  Worsening episodes only last for few seconds.  Patient reports that 20 years ago, he had an episode of dehydration and lightheadedness while working on the roof.  At that time, he was seen by a cardiologist and was found to have atrial fibrillation.  His atrial fibrillation was attributed to his dehydration.  He has never been on anticoagulation. He has not had recurrent symptoms of similar nature ever since then.   Current Outpatient Medications:    aspirin EC 81 MG tablet, Take 1 tablet (81 mg total) by mouth daily. Swallow whole. (Patient taking differently: Take 81 mg by mouth as needed. Swallow whole.), Disp: 90 tablet, Rfl: 3   nitroGLYCERIN (NITROSTAT) 0.4 MG SL tablet, Place 1 tablet  (0.4 mg total) under the tongue every 5 (five) minutes as needed for chest pain., Disp: 90 tablet, Rfl: 3   Probiotic Product (PROBIOTIC ACIDOPHILUS BIOBEADS PO), Take by mouth., Disp: , Rfl:    Cardiovascular and other pertinent studies:  Reviewed external labs and tests, independently interpreted  EKG 04/09/2022: Sinus rhythm 65 bpm First degree AV block Occaisonal PACs Incomplete RBBB Low voltage  Mobile cardiac telemetry 7 days 11/26/2021 - 12/04/2021: Dominant rhythm: Sinus.  HR 35-118 bpm. Avg HR 56 bpm, in sinus rhythm. 198 episodes of probable atrial tachycardia, fastest at 171 bpm for 9 beats, longest for 19 beats at 93 bpm. 9.5% isolated SVE, 7.7% couplet, 4.9% triplets. 0 episodes of VT. <1% isolated VE, couplet/triplets. No atrial fibrillation/atrial flutter/VT/high grade AV block, sinus pause >3sec noted. 0 patient triggered events.   Exercise nuclear stress test 11/26/2021: Normal ECG stress. The patient exercised for 4 minutes and 58 seconds of a Bruce protocol, achieving approximately 6.99 METs. The blood pressure response was normal. Myocardial perfusion is normal. Overall LV systolic function is normal without regional wall motion abnormalities. Stress LV EF: 51%.  No previous exam available for comparison. Low risk.    Echocardiogram 11/19/2021:  Normal LV systolic function with EF 61%. Left ventricle cavity is normal  in size. Normal global wall motion. Doppler evidence of grade I (impaired)  diastolic dysfunction, normal LAP. Calculated EF 61%.  Mild prolapse of the  mitral valve leaflets. No significant myxomatous  degeneration. Mild to moderate mitral regurgitation.  Structurally normal tricuspid valve.  Mild tricuspid regurgitation. No  evidence of pulmonary hypertension.  Structurally normal pulmonic valve.  Mild pulmonic regurgitation.  EKG 11/13/2021: Sinus rhythm with significant rate variation s/o sinus arhrtymia Cannot exclude sinoatrial exit  block Frequent PACs  No ischemic changes   Recent labs: 09/30/2021: Glucose 99, BUN/Cr 27/1.0. EGFR 71. Na/K 142/5.1. Rest of the CMP normal H/H 14/42. MCV 85. Platelets 149 HbA1C NA Chol 178, TG 114, HDL 40, LDL 115 TSH 2.0 normal    Review of Systems  Cardiovascular:  Negative for chest pain, dyspnea on exertion, leg swelling, palpitations and syncope.         Vitals:   04/09/22 0943  BP: (!) 146/66  Pulse: 60  Resp: 16  Temp: 98 F (36.7 C)  SpO2: 96%     Body mass index is 27.12 kg/m. Filed Weights   04/09/22 0943  Weight: 217 lb (98.4 kg)     Objective:   Physical Exam Vitals and nursing note reviewed.  Constitutional:      General: He is not in acute distress. Neck:     Vascular: No JVD.  Cardiovascular:     Rate and Rhythm: Normal rate and regular rhythm.     Heart sounds: Normal heart sounds. No murmur heard. Pulmonary:     Effort: Pulmonary effort is normal.     Breath sounds: Normal breath sounds. No wheezing or rales.  Musculoskeletal:     Right lower leg: No edema.     Left lower leg: No edema.          Visit diagnoses:   ICD-10-CM   1. Paroxysmal atrial tachycardia (HCC)  I47.1 EKG 12-Lead    2. Elevated blood pressure reading in office with white coat syndrome, without diagnosis of hypertension  R03.0          Assessment & Recommendations:   88 y.o. Caucasian male with paroxysmal atrial tachycardia, chest pain  Paroxysmal atrial tachycardia: Seen on cardiac telemetry.  He also has been rate variation, albeit without any symptoms. With resting heart rate in 50s and 140s, I do not recommend beta-blocker. Recommend watchful monitoring.  Patient reported remote history of paroxysmal atrial fibrillation without ever having been on anticoagulation.  I do not see the need to initiate it at this time.  Chest pain: No recurrence. I again discussed CT cardiac scoring for restratification with him.  He wants to hold off at this  time. Okay to hold off aspirin.  Mixed hyperlipidemia: He wants to hold off statin at this time.  Elevated blood pressure without diagnosis of hypertension: Blood pressure elevated today, generally normal.  I encouraged him to check regularly at home.  If consistently remains >SBP 140 mmHg, patient knows to call me.  F/u in 3 months   Nigel Mormon, MD Pager: [phone] Office: [phone]

## 2022-04-11 ENCOUNTER — Encounter: Payer: Self-pay | Admitting: Cardiology

## 2022-04-11 ENCOUNTER — Ambulatory Visit: Payer: Medicare HMO | Admitting: Cardiology

## 2022-04-11 DIAGNOSIS — R030 Elevated blood-pressure reading, without diagnosis of hypertension: Secondary | ICD-10-CM | POA: Insufficient documentation

## 2023-04-15 DIAGNOSIS — H5213 Myopia, bilateral: Secondary | ICD-10-CM | POA: Diagnosis not present

## 2023-04-15 DIAGNOSIS — Z961 Presence of intraocular lens: Secondary | ICD-10-CM | POA: Diagnosis not present

## 2023-06-24 DIAGNOSIS — E663 Overweight: Secondary | ICD-10-CM | POA: Diagnosis not present

## 2023-06-24 DIAGNOSIS — Z23 Encounter for immunization: Secondary | ICD-10-CM | POA: Diagnosis not present

## 2023-06-24 DIAGNOSIS — M1990 Unspecified osteoarthritis, unspecified site: Secondary | ICD-10-CM | POA: Diagnosis not present

## 2023-06-24 DIAGNOSIS — K5909 Other constipation: Secondary | ICD-10-CM | POA: Diagnosis not present

## 2023-06-24 DIAGNOSIS — R972 Elevated prostate specific antigen [PSA]: Secondary | ICD-10-CM | POA: Diagnosis not present

## 2023-06-24 DIAGNOSIS — I2089 Other forms of angina pectoris: Secondary | ICD-10-CM | POA: Diagnosis not present

## 2023-06-24 DIAGNOSIS — E785 Hyperlipidemia, unspecified: Secondary | ICD-10-CM | POA: Diagnosis not present

## 2023-09-18 ENCOUNTER — Encounter (HOSPITAL_COMMUNITY): Payer: Self-pay

## 2023-09-18 ENCOUNTER — Emergency Department (HOSPITAL_COMMUNITY)
Admission: EM | Admit: 2023-09-18 | Discharge: 2023-09-19 | Disposition: A | Discharge status: Home / Self Care | Payer: Medicare HMO | Attending: Emergency Medicine | Admitting: Emergency Medicine

## 2023-09-18 ENCOUNTER — Emergency Department (HOSPITAL_COMMUNITY): Payer: Medicare HMO

## 2023-09-18 DIAGNOSIS — J181 Lobar pneumonia, unspecified organism: Secondary | ICD-10-CM | POA: Diagnosis not present

## 2023-09-18 DIAGNOSIS — R531 Weakness: Secondary | ICD-10-CM | POA: Diagnosis not present

## 2023-09-18 DIAGNOSIS — R918 Other nonspecific abnormal finding of lung field: Secondary | ICD-10-CM | POA: Diagnosis not present

## 2023-09-18 DIAGNOSIS — Z743 Need for continuous supervision: Secondary | ICD-10-CM | POA: Diagnosis not present

## 2023-09-18 DIAGNOSIS — I4891 Unspecified atrial fibrillation: Secondary | ICD-10-CM | POA: Diagnosis not present

## 2023-09-18 DIAGNOSIS — Z20822 Contact with and (suspected) exposure to covid-19: Secondary | ICD-10-CM | POA: Insufficient documentation

## 2023-09-18 DIAGNOSIS — I700 Atherosclerosis of aorta: Secondary | ICD-10-CM | POA: Diagnosis not present

## 2023-09-18 DIAGNOSIS — R059 Cough, unspecified: Secondary | ICD-10-CM | POA: Diagnosis not present

## 2023-09-18 DIAGNOSIS — I480 Paroxysmal atrial fibrillation: Secondary | ICD-10-CM | POA: Diagnosis not present

## 2023-09-18 DIAGNOSIS — R079 Chest pain, unspecified: Secondary | ICD-10-CM | POA: Diagnosis not present

## 2023-09-18 DIAGNOSIS — Z7982 Long term (current) use of aspirin: Secondary | ICD-10-CM | POA: Diagnosis not present

## 2023-09-18 DIAGNOSIS — R0602 Shortness of breath: Secondary | ICD-10-CM | POA: Diagnosis present

## 2023-09-18 DIAGNOSIS — J189 Pneumonia, unspecified organism: Secondary | ICD-10-CM

## 2023-09-18 DIAGNOSIS — R2243 Localized swelling, mass and lump, lower limb, bilateral: Secondary | ICD-10-CM | POA: Diagnosis not present

## 2023-09-18 DIAGNOSIS — J9811 Atelectasis: Secondary | ICD-10-CM | POA: Diagnosis not present

## 2023-09-18 DIAGNOSIS — J90 Pleural effusion, not elsewhere classified: Secondary | ICD-10-CM | POA: Diagnosis not present

## 2023-09-18 LAB — CBC
HCT: 40.4 % (ref 39.0–52.0)
Hemoglobin: 12.8 g/dL — ABNORMAL LOW (ref 13.0–17.0)
MCH: 29.5 pg (ref 26.0–34.0)
MCHC: 31.7 g/dL (ref 30.0–36.0)
MCV: 93.1 fL (ref 80.0–100.0)
Platelets: 180 10*3/uL (ref 150–400)
RBC: 4.34 MIL/uL (ref 4.22–5.81)
RDW: 13.2 % (ref 11.5–15.5)
WBC: 7.3 10*3/uL (ref 4.0–10.5)
nRBC: 0 % (ref 0.0–0.2)

## 2023-09-18 LAB — TROPONIN I (HIGH SENSITIVITY)
Troponin I (High Sensitivity): 8 ng/L (ref <18)
Troponin I (High Sensitivity): 8 ng/L (ref <18)

## 2023-09-18 LAB — COMPREHENSIVE METABOLIC PANEL
ALT: 22 U/L (ref 0–44)
AST: 26 U/L (ref 15–41)
Albumin: 3.2 g/dL — ABNORMAL LOW (ref 3.5–5.0)
Alkaline Phosphatase: 92 U/L (ref 38–126)
Anion gap: 11 (ref 5–15)
BUN: 28 mg/dL — ABNORMAL HIGH (ref 8–23)
CO2: 24 mmol/L (ref 22–32)
Calcium: 8.8 mg/dL — ABNORMAL LOW (ref 8.9–10.3)
Chloride: 107 mmol/L (ref 98–111)
Creatinine, Ser: 1.22 mg/dL (ref 0.61–1.24)
GFR, Estimated: 57 mL/min — ABNORMAL LOW (ref >60)
Glucose, Bld: 121 mg/dL — ABNORMAL HIGH (ref 70–99)
Potassium: 4.5 mmol/L (ref 3.5–5.1)
Sodium: 142 mmol/L (ref 135–145)
Total Bilirubin: 0.6 mg/dL (ref 0.0–1.2)
Total Protein: 6.5 g/dL (ref 6.5–8.1)

## 2023-09-18 NOTE — ED Triage Notes (Signed)
Coming from doctor's office. Sent here for shortness of breath, abnormal EKG showing aflutter which is new onset. Pt reports cough for a week. CXR recently showing pneumonia. A&Ox4.

## 2023-09-18 NOTE — ED Provider Triage Note (Signed)
Emergency Medicine Provider Triage Evaluation Note  Cody Hudson , a 89 y.o. male  was evaluated in triage.  Pt complains of cough ongoing for past couple weeks, sent over by primary care for possible bilateral pneumonia.  Also states he was found to have A-fib at the office, and sent over for further evaluation.  States he had an episode of A-fib many many years ago, but no episodes since.  Denies any chest pain or shortness of breath currently.  Review of Systems  Positive: As above Negative: As above  Physical Exam  BP 129/67 (BP Location: Right Arm)   Pulse 74   Temp 98.6 F (37 C) (Oral)   Resp 15   Ht 6\' 3"  (1.905 m)   Wt 98.4 kg   SpO2 100%   BMI 27.11 kg/m  Gen:   Awake, no distress   Resp:  Normal effort  MSK:   Moves extremities without difficulty    Medical Decision Making  Medically screening exam initiated at 5:55 PM.  Appropriate orders placed.  Cody Hudson was informed that the remainder of the evaluation will be completed by another provider, this initial triage assessment does not replace that evaluation, and the importance of remaining in the ED until their evaluation is complete.     Cody Merles, PA-C 09/18/23 1755

## 2023-09-18 NOTE — ED Provider Notes (Signed)
[place] EMERGENCY DEPARTMENT AT [facility] Provider Note  CSN: [id number] Arrival date & time: 09/18/23 1712  Chief Complaint(s) Abnormal ECG  HPI Cody Hudson is a 89 y.o. male {Add pertinent medical, surgical, social history, OB history to HPI:1}   HPI  Past Medical History Past Medical History:  Diagnosis Date   Arthritis    BPH (benign prostatic hyperplasia)    Diverticulosis of colon    Foley catheter in place    Hematuria    History of adenomatous polyp of colon    2011-- adenomatous;   09-12-2015  tubular adenoma   Urinary retention    Wears glasses    Patient Active Problem List   Diagnosis Date Noted   Elevated blood pressure reading in office with white coat syndrome, without diagnosis of hypertension 04/11/2022   Paroxysmal atrial fibrillation (HCC) 04/09/2022   Precordial pain 12/11/2021   Irregular heart beat 12/11/2021   BPH with urinary obstruction 06/27/2016   RECTAL BLEEDING 06/24/2010   History of colonic polyps 06/24/2010   Home Medication(s) Prior to Admission medications   Medication Sig Start Date End Date Taking? Authorizing Provider  aspirin EC 81 MG tablet Take 1 tablet (81 mg total) by mouth daily. Swallow whole. Patient taking differently: Take 81 mg by mouth as needed. Swallow whole. 11/13/21   Patwardhan, Anabel Bene, MD  nitroGLYCERIN (NITROSTAT) 0.4 MG SL tablet Place 1 tablet (0.4 mg total) under the tongue every 5 (five) minutes as needed for chest pain. 11/14/21 04/09/22  Patwardhan, Anabel Bene, MD  Probiotic Product (PROBIOTIC ACIDOPHILUS BIOBEADS PO) Take by mouth.    [provider]                                                                                                                                    Allergies Patient has no known allergies.  Review of Systems Review of Systems As noted in HPI  Physical Exam Vital Signs  I have reviewed the triage vital signs BP (!) 114/59 (BP Location: Right Arm)    Pulse (!) 107   Temp 98.4 F (36.9 C) (Oral)   Resp 18   Ht 6\' 3"  (1.905 m)   Wt 98.4 kg   SpO2 98%   BMI 27.11 kg/m  *** Physical Exam  ED Results and Treatments Labs (all labs ordered are listed, but only abnormal results are displayed) Labs Reviewed  CBC - Abnormal; Notable for the following components:      Result Value   Hemoglobin 12.8 (*)    All other components within normal limits  COMPREHENSIVE METABOLIC PANEL - Abnormal; Notable for the following components:   Glucose, Bld 121 (*)    BUN 28 (*)    Calcium 8.8 (*)    Albumin 3.2 (*)    GFR, Estimated 57 (*)    All other components within normal limits  RESP PANEL BY RT-PCR (RSV, FLU  A&B, COVID)  [id number]  TROPONIN I (HIGH SENSITIVITY)  TROPONIN I (HIGH SENSITIVITY)                                                                                                                         EKG  EKG Interpretation Date/Time:  Friday September 18 2023 17:28:38 EST Ventricular Rate:  91 PR Interval:    QRS Duration:  92 QT Interval:  364 QTC Calculation: 447 R Axis:   98  Text Interpretation: Atrial fibrillation Rightward axis Incomplete right bundle branch block Abnormal ECG When compared with ECG of 08-Jul-2016 11:27, PREVIOUS ECG IS PRESENT Confirmed by Drema Pry [phone]) on 09/18/2023 11:09:18 PM       Radiology DG Chest 2 View Result Date: 09/18/2023 CLINICAL DATA:  Chest pain EXAM: CHEST - 2 VIEW COMPARISON:  None Available. FINDINGS: Tiny pleural effusions. Slight linear opacity lung bases likely scar or atelectasis. No consolidation, pneumothorax or edema. Normal cardiopericardial silhouette. Calcified aorta. Degenerative changes along the spine IMPRESSION: Small pleural effusions. Mild basilar opacity. Atelectasis favored over infiltrate. Recommend follow-up Electronically Signed   By: Karen Kays M.D.   On: 09/18/2023 18:16    Medications Ordered in ED Medications - No data to  display Procedures Procedures  (including critical care time) Medical Decision Making / ED Course   Medical Decision Making Amount and/or Complexity of Data Reviewed Labs: ordered. Radiology: ordered.    ***    Final Clinical Impression(s) / ED Diagnoses Final diagnoses:  None    This chart was dictated using voice recognition software.  Despite best efforts to proofread,  errors can occur which can change the documentation meaning.

## 2023-09-19 ENCOUNTER — Emergency Department (HOSPITAL_COMMUNITY): Payer: Medicare HMO

## 2023-09-19 DIAGNOSIS — R918 Other nonspecific abnormal finding of lung field: Secondary | ICD-10-CM | POA: Diagnosis not present

## 2023-09-19 DIAGNOSIS — I700 Atherosclerosis of aorta: Secondary | ICD-10-CM | POA: Diagnosis not present

## 2023-09-19 DIAGNOSIS — J90 Pleural effusion, not elsewhere classified: Secondary | ICD-10-CM | POA: Diagnosis not present

## 2023-09-19 DIAGNOSIS — R079 Chest pain, unspecified: Secondary | ICD-10-CM | POA: Diagnosis not present

## 2023-09-19 LAB — URINALYSIS, W/ REFLEX TO CULTURE (INFECTION SUSPECTED)
Bacteria, UA: NONE SEEN
Bilirubin Urine: NEGATIVE
Glucose, UA: NEGATIVE mg/dL
Hgb urine dipstick: NEGATIVE
Ketones, ur: NEGATIVE mg/dL
Leukocytes,Ua: NEGATIVE
Nitrite: NEGATIVE
Protein, ur: NEGATIVE mg/dL
Specific Gravity, Urine: 1.027 (ref 1.005–1.030)
pH: 5 (ref 5.0–8.0)

## 2023-09-19 LAB — RESP PANEL BY RT-PCR (RSV, FLU A&B, COVID)  RVPGX2
Influenza A by PCR: NEGATIVE
Influenza B by PCR: NEGATIVE
Resp Syncytial Virus by PCR: NEGATIVE
SARS Coronavirus 2 by RT PCR: NEGATIVE

## 2023-09-19 LAB — BRAIN NATRIURETIC PEPTIDE: B Natriuretic Peptide: 253.9 pg/mL — ABNORMAL HIGH (ref 0.0–100.0)

## 2023-09-19 MED ORDER — AMOXICILLIN-POT CLAVULANATE 875-125 MG PO TABS
1.0000 | ORAL_TABLET | Freq: Once | ORAL | Status: AC
  Administered 2023-09-19: 1 via ORAL
  Filled 2023-09-19: qty 1

## 2023-09-19 MED ORDER — AZITHROMYCIN 250 MG PO TABS: ORAL_TABLET | ORAL | 0 refills | Status: DC

## 2023-09-19 MED ORDER — AZITHROMYCIN 250 MG PO TABS
500.0000 mg | ORAL_TABLET | Freq: Once | ORAL | Status: AC
  Administered 2023-09-19: 500 mg via ORAL
  Filled 2023-09-19: qty 2

## 2023-09-19 MED ORDER — AMOXICILLIN-POT CLAVULANATE 875-125 MG PO TABS
1.0000 | ORAL_TABLET | Freq: Two times a day (BID) | ORAL | 0 refills | Status: DC
Start: 2023-09-19 — End: 2023-10-26

## 2023-09-21 ENCOUNTER — Encounter: Payer: Self-pay | Admitting: Cardiology

## 2023-09-21 ENCOUNTER — Ambulatory Visit: Payer: Medicare HMO | Attending: Cardiology | Admitting: Cardiology

## 2023-09-21 VITALS — BP 108/72 | HR 80 | Resp 16 | Ht 75.0 in | Wt 208.6 lb

## 2023-09-21 DIAGNOSIS — I480 Paroxysmal atrial fibrillation: Secondary | ICD-10-CM | POA: Diagnosis not present

## 2023-09-21 DIAGNOSIS — R0683 Snoring: Secondary | ICD-10-CM | POA: Diagnosis not present

## 2023-09-21 MED ORDER — APIXABAN 5 MG PO TABS: 5.0000 mg | ORAL_TABLET | Freq: Two times a day (BID) | ORAL | 3 refills | Status: DC

## 2023-09-21 MED ORDER — APIXABAN 5 MG PO TABS: 5.0000 mg | ORAL_TABLET | Freq: Two times a day (BID) | ORAL | 0 refills | Status: DC

## 2023-09-21 NOTE — Patient Instructions (Signed)
Medication Instructions:  Start Eliquis 5 mg twice daily  *If you need a refill on your cardiac medications before your next appointment, please call your pharmacy*  Testing/Procedures: Your physician has requested that you have an echocardiogram. Echocardiography is a painless test that uses sound waves to create images of your heart. It provides your doctor with information about the size and shape of your heart and how well your heart's chambers and valves are working. This procedure takes approximately one hour. There are no restrictions for this procedure. Please do NOT wear cologne, perfume, aftershave, or lotions (deodorant is allowed). Please arrive 15 minutes prior to your appointment time.  Please note: We ask at that you not bring children with you during ultrasound (echo/ vascular) testing. Due to room size and safety concerns, children are not allowed in the ultrasound rooms during exams. Our front office staff cannot provide observation of children in our lobby area while testing is being conducted. An adult accompanying a patient to their appointment will only be allowed in the ultrasound room at the discretion of the ultrasound technician under special circumstances. We apologize for any inconvenience.   Your physician has recommended that you have a split night sleep study. This test records several body functions during sleep, including: brain activity, eye movement, oxygen and carbon dioxide blood levels, heart rate and rhythm, breathing rate and rhythm, the flow of air through your mouth and nose, snoring, body muscle movements, and chest and belly movement. OUR SLEEP STUDY COORDINATORS WILL BE IN CONTACT SOON TO HELP SCHEDULE AN APPOINTMENT.  Follow Up 4-6 weeks with APP or Dr. Rosemary Holms

## 2023-09-21 NOTE — Progress Notes (Signed)
Cardiology Office Note:  .   Date:  09/21/2023  ID:  Cody Hudson, DOB 1933-09-06, MRN [id number] PCP: Geoffry Paradise, MD  [facility] Providers Cardiologist:  Truett Mainland, MD PCP: Geoffry Paradise, MD  Chief Complaint  Patient presents with   Paroxysmal atrial tachycardia    Hospitalization Follow-up      History of Present Illness: .    Cody Hudson is a 89 y.o. male with paroxysmal Afib  Patient was last seen by me in 12/2021.  At that time, he was noted to have paroxysmal atrial tachycardia but no A-fib was seen on 7 telemetry.  He previously had remote history of A-fib, but had never been on anticoagulation.  Given that I did not discover any new A-fib, we had not started anticoagulation at that time.  Patient was recently seen in 08/2023 after he was found to have A-fib on EKG during PCP office.  He was also found to have community-acquired pneumonia of right lower lobe of lung.  He was discharged on antibiotics, with follow-up scheduled with me.  He still has occasional chills and cough, but no fevers. He is on day 3 out of 7 of his antibiotics. Until December, he was still walking 5-6 miles daily without any complaints. He still denies any chest pain. Dyspnea, palpitations. Daughter has noticed that he gets very tired towards the end of the day recently. He does endorse snoring at night, but feels refreshed when he wakes up in the morning.   Vitals:   09/21/23 1601  BP: 108/72  Pulse: 80  Resp: 16  SpO2: 96%     ROS:  Review of Systems  Constitutional: Positive for malaise/fatigue.  Cardiovascular:  Negative for chest pain, dyspnea on exertion, leg swelling, palpitations and syncope.  Respiratory:  Positive for cough.      Studies Reviewed: Marland Kitchen        EKG 09/21/2023: Atrial fibrillation 80 bpm Low voltage QRS When compared with ECG of 18-Sep-2023 17:28, No significant change was found    EKG 09/18/2023: A-fib with controlled ventricular  rate  Independently interpreted 1/24//2025: Hb 12.8 Cr 1.22, eGFR 57 Trop HS 8, 8 BNP 253  CT cardiac scoring 12/2021: 1. Total Agatston Score: 33.8 2.  Aortic Atherosclerosis  Exercise nuclear stress test 11/2021: Normal ECG stress. The patient exercised for 4 minutes and 58 seconds of a Bruce protocol, achieving approximately 6.99 METs. The blood pressure response was normal. Myocardial perfusion is normal. Overall LV systolic function is normal without regional wall motion abnormalities. Stress LV EF: 51%.  No previous exam available for comparison. Low risk.    Echocardiogram 10/2021:  Normal LV systolic function with EF 61%. Left ventricle cavity is normal  in size. Normal global wall motion. Doppler evidence of grade I (impaired)  diastolic dysfunction, normal LAP. Calculated EF 61%.  Mild prolapse of the mitral valve leaflets. No significant myxomatous  degeneration. Mild to moderate mitral regurgitation.  Structurally normal tricuspid valve.  Mild tricuspid regurgitation. No  evidence of pulmonary hypertension.  Structurally normal pulmonic valve.  Mild pulmonic regurgitation.     Risk Assessment/Calculations:    CHA2DS2-VASc Score = 2  This indicates a 2.2% annual risk of stroke. The patient's score is based upon: CHF History: 0 HTN History: 0 Diabetes History: 0 Stroke History: 0 Vascular Disease History: 0 Age Score: 2 Gender Score: 0       Physical Exam:   Physical Exam Vitals and nursing note reviewed.  Constitutional:  General: He is not in acute distress. Neck:     Vascular: No JVD.  Cardiovascular:     Rate and Rhythm: Normal rate. Rhythm irregular.     Heart sounds: Normal heart sounds. No murmur heard. Pulmonary:     Effort: Pulmonary effort is normal.     Breath sounds: Normal breath sounds. No wheezing or rales.  Musculoskeletal:     Right lower leg: No edema.     Left lower leg: No edema.      VISIT DIAGNOSES:   ICD-10-CM   1.  Paroxysmal atrial fibrillation (HCC)  I48.0 EKG 12-Lead       ASSESSMENT AND PLAN: .    Cody Hudson is a 89 y.o. male with paroxysmal A-fib  Paroxysmal A-fib: Reported remote history, but mostly PAT noted on monitor in 2023. Now, found to be in A-fib during ER visit with abnormal EKG, also incidental finding of community-acquired pneumonia. His ventricular rate is controlled.  He is asymptomatic from A-fib standpoint other than fatigue. Fatigue could also be due to recent pneumonia or possible OSA. Given his age, absence of symptoms, and controlled rate without any medications, we can pursue conservative monitoring. Given his annual stroke risk of 2.2%, recommend anticoagulation with Eliquis 5 mg twice daily. Discussed risks and benefits of anticoagulation. He and daughter understand.  Will obtain echocardiogram.   I will Hudson him back in 4-6 weeks to reassess if he is still in Afib, and if fatigue has improved.I will also obtain a sleep study. At 6 weeks, if he is still in Afib, and remains fatigued, or if EF Is reduced, or has significant valvular abnormality, we could consider cardioversion. Otherwise, okay to pursue watchful monitoring of his Afib.       Meds ordered this encounter  Medications   apixaban (ELIQUIS) 5 MG TABS tablet    Sig: Take 1 tablet (5 mg total) by mouth 2 (two) times daily.    Dispense:  90 tablet    Refill:  3   apixaban (ELIQUIS) 5 MG TABS tablet    Sig: Take 1 tablet (5 mg total) by mouth 2 (two) times daily.    Dispense:  56 tablet    Refill:  0    Lot Number?:   [id number]    Expiration Date?:   08/25/2024     F/u in 4-6 weeks  Signed, Elder Negus, MD

## 2023-09-25 ENCOUNTER — Encounter: Payer: Self-pay | Admitting: Cardiology

## 2023-09-25 ENCOUNTER — Ambulatory Visit: Payer: No Typology Code available for payment source | Admitting: Cardiology

## 2023-09-25 NOTE — Telephone Encounter (Signed)
Could be check and see if he would qualify for patient assistance ?  Message below for the patient:  Unfortunately Eliquis does not have generic option.  Switching her medication would be older medication warfarin, which would require frequent thinness testing.  Eliquis, Xarelto and similar medications have proven to be equally effective compared to warfarin, but have slightly safer too.  We should be able to see if you would qualify for patient assistance rhythm and factor, which could reduce the cost.   Thanks MJP

## 2023-09-25 NOTE — Telephone Encounter (Signed)
Returning call to nurse. Please call on cell number.

## 2023-09-25 NOTE — Telephone Encounter (Signed)
Left message to call back to discuss/explain in more detail

## 2023-09-28 ENCOUNTER — Telehealth: Payer: Self-pay

## 2023-09-28 NOTE — Telephone Encounter (Signed)
**Note De-Identified Ketsia Linebaugh Obfuscation** I started a Split Night Sleep Study PA through the [employer] provider portal. Case: [id number]

## 2023-09-29 ENCOUNTER — Telehealth: Payer: Self-pay | Admitting: Cardiology

## 2023-09-29 NOTE — Telephone Encounter (Signed)
Paper Work Dropped Off: Alver Fisher  Date:09/29/23  Location of paper:  Dr. Rosemary Holms Box

## 2023-10-02 NOTE — Telephone Encounter (Signed)
**Note De-Identified Shante Archambeault Obfuscation** Letter received from [employer] stating that they have approved the pts Split Night Sleep Study from 09/28/2023-03/26/2024. Auth #: [id number].  I have forwarded the pts Split Night Sleep Study order to the sleep lab so they can contact the pt to schedule the test.

## 2023-10-05 NOTE — Telephone Encounter (Signed)
 Returned a call back to the pts daughter Cody Hudson.  Cody Hudson states she talked with the pt about our conversation earlier, in regards to his incomplete [name] pt assistance forms.  Cody Hudson states she will be bringing the pt to the office today so that he can sign the pt portion of the assistance forms as well as she will bring his W2's from last year to go with the forms, so that we can send these off for further completion to our pt assistance team thereafter.   Informed Tara that I will leave the forms now at the front desk and once they complete their part, I will have our front desk team notify me in the pod thereafter, so that I can fax ALL forms to our pt assistance team/[employer].  Cody Hudson verbalized understanding and agrees with this plan.

## 2023-10-05 NOTE — Telephone Encounter (Signed)
 Daughter Alexandria Ida) returned RN's call regarding patient's paperwork.

## 2023-10-05 NOTE — Telephone Encounter (Signed)
 Pt assistance forms MD page signed by Dr. Filiberto Hug.  Pt did forget to sign the pt portion of the [facility] assistance forms.   Pt will also need to bring in proof of income to submit all forms to our pt assistance team.  Called the pt about this and he said he will bring in all the rest of his paperwork/proof of income on 1/17, when he comes into the office to see Dr. Filiberto Hug for follow-up.  Pt states he will also sign the pt portion of the forms at that time as well.  He asked that I hold onto the forms until then.  Pt is aware I will hold onto these forms and we will get this all taken care of and sent off to our pt assistance team, thereafter.   Pt verbalized understanding and agrees with this plan.

## 2023-10-06 NOTE — Telephone Encounter (Signed)
Pt assistance forms completed and signed. W2 attached. Documents scanned to [facility] for further management.

## 2023-10-07 ENCOUNTER — Telehealth: Payer: Self-pay

## 2023-10-07 ENCOUNTER — Other Ambulatory Visit (HOSPITAL_COMMUNITY): Payer: Self-pay

## 2023-10-07 NOTE — Telephone Encounter (Signed)
Received an incomplete application. Need POI for wife and rx spending report for both parties. Tara-daughter is aware and working to obtain documents. Patient likely doesn't meet PAP requirements yet and has requested more samples while BMS app is still in progress.

## 2023-10-07 NOTE — Telephone Encounter (Signed)
Received, thank you

## 2023-10-12 ENCOUNTER — Ambulatory Visit (HOSPITAL_COMMUNITY): Payer: Medicare HMO | Attending: Cardiology

## 2023-10-12 DIAGNOSIS — I480 Paroxysmal atrial fibrillation: Secondary | ICD-10-CM | POA: Diagnosis not present

## 2023-10-12 LAB — ECHOCARDIOGRAM COMPLETE
Area-P 1/2: 4.11 cm2
S' Lateral: 3.3 cm

## 2023-10-13 NOTE — Progress Notes (Signed)
EF noted to be reduced at 30 to 35%.  This potentially could be a reason behind his fatigue, in addition to atrial fibrillation. With the new finding of low EF, I do recommend anticoagulation after at least 4-6 weeks of uninterrupted anticoagulation.  In addition, given reduced EF, it may still be advisable to consider TEE before the cardioversion to rule out any clot.  I see that he is scheduled to see Eligha Bridegroom in 2 weeks.  The timeline may work out well to discuss TEE cardioversion further at this visit.   Thanks MJP

## 2023-10-15 ENCOUNTER — Other Ambulatory Visit (HOSPITAL_COMMUNITY): Payer: Self-pay

## 2023-10-15 NOTE — Telephone Encounter (Signed)
Spoke to daughter she wants to stop app process for now as patient is not close to meeting requirements. Daughter will reach out to plan about MPP option to see if that's any cheaper for pt.

## 2023-10-25 NOTE — Progress Notes (Unsigned)
 Cardiology Office Note:  .   Date:  10/26/2023  ID:  REAKWON BARREN, DOB 08/28/1933, MRN [id number] PCP: Geoffry Paradise, MD  [facility] Providers Cardiologist:  None    Patient Profile: .      PMH Paroxysmal atrial tachycardia Paroxysmal atrial fibrillation on chronic anticoagulation HFrEF Coronary artery disease CT Calcium score 12/24/2021 CAC 33.8  LM 33.8, LAD 0, LCx 0, RCA 0 Hyperlipidemia  He presented to [facility] cardiology 12/11/2021 for evaluation of chest pain which he described as "gas-like" sensation.  Discomfort present on and off during the day.  It seems to get worse with deep breathing as well as walking inside the house but improves with rest.  Worsening episodes only last for few seconds.  He remains active after retirement from [employer].  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  He has been athletic all his life and ran until 4 years ago.  He continues to walk and had walked up to 10,000 steps on trails just prior to initial visit.  He reported 20 years prior he had an episode of dehydration and lightheadedness while working on a roof.  At that time he was seen by cardiologist and found to have atrial fibrillation.  His atrial fibrillation was attributed to his deep hydration and he was never on anticoagulation.  No recurrence to his awareness.  Echo 11/19/2021 revealed normal LVEF, G1 DD, mild prolapse of mitral leaflets, mild to moderate AR, mild pulmonic regurgitation.  Cardiac monitor completed 12/04/2021 revealed 198 episodes of probable atrial tachycardia longest lasting for 19 beats, no atrial fibrillation/flutter/VT/high-grade AV block.  Exercise nuclear stress test 11/2021 was low risk.  At follow-up visit with Dr. Rosemary Holms 04/09/2022, he reported no recurrence of chest pain.  No AV nodal blocker agents were started due to resting heart rate in the 50s. CT calcium score obtained 12/2021 revealed mildly elevated  CAC score of 33.8 with the bulk of calcification in the left main.  He was advised to start rosuvastatin 10 mg daily, but it appears he did not wish to do so.  Seen by Dr. Rosemary Holms 09/21/2023 with recent finding of A-fib on EKG during PCP visit.  He was also found to have community-acquired pneumonia of the right lower lobe treated with antibiotics.  Up until December 2024 he was walking 5 to 6 miles daily with no concerning cardiac symptoms.  His daughter had noted he gets very tired towards the end of the day recently.  He does endorse snoring but wakes up feeling refreshed.  He reported fatigue but no other symptoms of A-fib.  Eliquis 5 mg twice daily was recommended for stroke prevention for CHA2DS2-VASc score of 2.  Plan to return in 4 to 6 weeks. Echo was repeateded 10/12/23 and unfortunately revealed reduced LVEF 30 to 35%, mildly enlarged RV, severe biatrial enlargement, mild to moderate MR, mild calcification of the aortic valve with no stenosis, mild dilatation of the aortic root measuring 40 mm.        History of Present Illness: .   MOSTYN VARNELL is a very pleasant 89 y.o. male who is here for follow-up of a fib and newly reduced LVEF. He is accompanied by his daughter. He reports he generally feels well and continues to paint for leisure and walks daily for exercise.  He admits to intermittent dizziness and new onset leg swelling. Reports that HR is "all over the place."  He denies  shortness of  breath, orthopnea, PND, chest pain, presyncope or syncope. Notes decrease in circulation in his hands, but this does not cause significant discomfort. He reports no missed doses of Eliquis. He expresses a preference for avoiding additional medications and procedures if possible, but is open to considering options for managing a fib.   Discussed the use of AI scribe software for clinical note transcription with the patient, who gave verbal consent to proceed.   ROS: See HPI       Studies Reviewed:  Marland Kitchen   EKG Interpretation Date/Time:  Monday October 26 2023 15:08:14 EST Ventricular Rate:  62 PR Interval:    QRS Duration:  84 QT Interval:  412 QTC Calculation: 418 R Axis:   25  Text Interpretation: Atrial fibrillation Low voltage QRS When compared with ECG of 21-Sep-2023 16:04, No significant change was found Confirmed by Eligha Bridegroom [phone]) on 10/26/2023 3:11:15 PM    Risk Assessment/Calculations:    CHA2DS2-VASc Score = 2   This indicates a 2.2% annual risk of stroke. The patient's score is based upon: CHF History: 0 HTN History: 0 Diabetes History: 0 Stroke History: 0 Vascular Disease History: 0 Age Score: 2 Gender Score: 0            Physical Exam:   VS:  BP 128/76   Pulse 62   Ht 6\' 3"  (1.905 m)   Wt 212 lb 12.8 oz (96.5 kg)   SpO2 97%   BMI 26.60 kg/m    Wt Readings from Last 3 Encounters:  10/26/23 212 lb 12.8 oz (96.5 kg)  09/21/23 208 lb 9.6 oz (94.6 kg)  09/18/23 216 lb 14.9 oz (98.4 kg)    GEN: Well nourished, well developed in no acute distress NECK: No JVD; No carotid bruits CARDIAC: Irregular RR, no murmurs, rubs, gallops RESPIRATORY:  Clear to auscultation without rales, wheezing or rhonchi  ABDOMEN: Soft, non-tender, non-distended EXTREMITIES:  No edema; No deformity     ASSESSMENT AND PLAN: .    Atrial fibrillation on chronic anticoagulation: EKG reveals atrial fibrillation with well-controlled ventricular rate at 62 bpm.  Lengthy discussion with patient and his daughter regarding treatment of A-fib.  As noted below, recent echo reveals reduced LVEF.  We discussed treatment options including early cardioversion, antiarrhythmic medication, or consideration of ablation.  He would like to limit medications as much as possible.  Discussion with Dr. Rosemary Holms who feels cardiomyopathy is 2/2 a fib.  We are limited on CCB/BB use due to resting bradycardia. After further discussion, risks of TEE/DCCV reviewed and he agrees to proceed.  No missed doses  of Eliquis.  Continue Eliquis 5 mg twice daily which is appropriate dose for stroke prevention for CHA2DS2-VASc score of 2. I will see him back a few weeks after cardioversion for management of NICM. We will get pre-procedure labs today.   NICM: LVEF 30-35% on TTE 10/12/23 thought to be secondary to atrial fibrillation. He remains very active with daily walking, often getting > 10000 steps daily. He has mild LE edema that has developed since last office visit. No shortness of breath, orthopnea, PND. Weight is stable. HR remains well-controlled.  After lengthy discussion, he would like to proceed with TEE/DCCV as noted above.  I will see him back 2 to 3 weeks after cardioversion to address medical management of NICM.  CAD: Coronary calcium score of 33.8 12/2021. He denies chest pain, dyspnea, or other symptoms concerning for angina.  No indication for further ischemic evaluation at this time.  Need  to address LDL goal of 70 or lower at next office visit.  Hyperlipidemia LDL goal < 70: Not specifically addressed today.  Plan to discuss at next office visit.    Informed Consent   Shared Decision Making/Informed Consent   The risks [stroke, cardiac arrhythmias rarely resulting in the need for a temporary or permanent pacemaker, skin irritation or burns, esophageal damage, perforation (1:10,000 risk), bleeding, pharyngeal hematoma as well as other potential complications associated with conscious sedation including aspiration, arrhythmia, respiratory failure and death], benefits (treatment guidance, restoration of normal sinus rhythm, diagnostic support) and alternatives of a transesophageal echocardiogram guided cardioversion were discussed in detail with Mr. Kampf and he is willing to proceed.     Disposition:4 weeks with me  Signed, Eligha Bridegroom, NP-C

## 2023-10-25 NOTE — H&P (View-Only) (Signed)
 Cardiology Office Note:  .   Date:  10/26/2023  ID:  Cody Hudson, DOB 08/28/1933, MRN [id number] PCP: Geoffry Paradise, MD  [facility] Providers Cardiologist:  None    Patient Profile: .      PMH Paroxysmal atrial tachycardia Paroxysmal atrial fibrillation on chronic anticoagulation HFrEF Coronary artery disease CT Calcium score 12/24/2021 CAC 33.8  LM 33.8, LAD 0, LCx 0, RCA 0 Hyperlipidemia  He presented to [facility] cardiology 12/11/2021 for evaluation of chest pain which he described as "gas-like" sensation.  Discomfort present on and off during the day.  It seems to get worse with deep breathing as well as walking inside the house but improves with rest.  Worsening episodes only last for few seconds.  He remains active after retirement from [employer].  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  He has been athletic all his life and ran until 4 years ago.  He continues to walk and had walked up to 10,000 steps on trails just prior to initial visit.  He reported 20 years prior he had an episode of dehydration and lightheadedness while working on a roof.  At that time he was seen by cardiologist and found to have atrial fibrillation.  His atrial fibrillation was attributed to his deep hydration and he was never on anticoagulation.  No recurrence to his awareness.  Echo 11/19/2021 revealed normal LVEF, G1 DD, mild prolapse of mitral leaflets, mild to moderate AR, mild pulmonic regurgitation.  Cardiac monitor completed 12/04/2021 revealed 198 episodes of probable atrial tachycardia longest lasting for 19 beats, no atrial fibrillation/flutter/VT/high-grade AV block.  Exercise nuclear stress test 11/2021 was low risk.  At follow-up visit with Dr. Rosemary Holms 04/09/2022, he reported no recurrence of chest pain.  No AV nodal blocker agents were started due to resting heart rate in the 50s. CT calcium score obtained 12/2021 revealed mildly elevated  CAC score of 33.8 with the bulk of calcification in the left main.  He was advised to start rosuvastatin 10 mg daily, but it appears he did not wish to do so.  Seen by Dr. Rosemary Holms 09/21/2023 with recent finding of A-fib on EKG during PCP visit.  He was also found to have community-acquired pneumonia of the right lower lobe treated with antibiotics.  Up until December 2024 he was walking 5 to 6 miles daily with no concerning cardiac symptoms.  His daughter had noted he gets very tired towards the end of the day recently.  He does endorse snoring but wakes up feeling refreshed.  He reported fatigue but no other symptoms of A-fib.  Eliquis 5 mg twice daily was recommended for stroke prevention for CHA2DS2-VASc score of 2.  Plan to return in 4 to 6 weeks. Echo was repeateded 10/12/23 and unfortunately revealed reduced LVEF 30 to 35%, mildly enlarged RV, severe biatrial enlargement, mild to moderate MR, mild calcification of the aortic valve with no stenosis, mild dilatation of the aortic root measuring 40 mm.        History of Present Illness: .   Cody Hudson is a very pleasant 89 y.o. male who is here for follow-up of a fib and newly reduced LVEF. He is accompanied by his daughter. He reports he generally feels well and continues to paint for leisure and walks daily for exercise.  He admits to intermittent dizziness and new onset leg swelling. Reports that HR is "all over the place."  He denies  shortness of  breath, orthopnea, PND, chest pain, presyncope or syncope. Notes decrease in circulation in his hands, but this does not cause significant discomfort. He reports no missed doses of Eliquis. He expresses a preference for avoiding additional medications and procedures if possible, but is open to considering options for managing a fib.   Discussed the use of AI scribe software for clinical note transcription with the patient, who gave verbal consent to proceed.   ROS: See HPI       Studies Reviewed:  Marland Kitchen   EKG Interpretation Date/Time:  Monday October 26 2023 15:08:14 EST Ventricular Rate:  62 PR Interval:    QRS Duration:  84 QT Interval:  412 QTC Calculation: 418 R Axis:   25  Text Interpretation: Atrial fibrillation Low voltage QRS When compared with ECG of 21-Sep-2023 16:04, No significant change was found Confirmed by Eligha Bridegroom [phone]) on 10/26/2023 3:11:15 PM    Risk Assessment/Calculations:    CHA2DS2-VASc Score = 2   This indicates a 2.2% annual risk of stroke. The patient's score is based upon: CHF History: 0 HTN History: 0 Diabetes History: 0 Stroke History: 0 Vascular Disease History: 0 Age Score: 2 Gender Score: 0            Physical Exam:   VS:  BP 128/76   Pulse 62   Ht 6\' 3"  (1.905 m)   Wt 212 lb 12.8 oz (96.5 kg)   SpO2 97%   BMI 26.60 kg/m    Wt Readings from Last 3 Encounters:  10/26/23 212 lb 12.8 oz (96.5 kg)  09/21/23 208 lb 9.6 oz (94.6 kg)  09/18/23 216 lb 14.9 oz (98.4 kg)    GEN: Well nourished, well developed in no acute distress NECK: No JVD; No carotid bruits CARDIAC: Irregular RR, no murmurs, rubs, gallops RESPIRATORY:  Clear to auscultation without rales, wheezing or rhonchi  ABDOMEN: Soft, non-tender, non-distended EXTREMITIES:  No edema; No deformity     ASSESSMENT AND PLAN: .    Atrial fibrillation on chronic anticoagulation: EKG reveals atrial fibrillation with well-controlled ventricular rate at 62 bpm.  Lengthy discussion with patient and his daughter regarding treatment of A-fib.  As noted below, recent echo reveals reduced LVEF.  We discussed treatment options including early cardioversion, antiarrhythmic medication, or consideration of ablation.  He would like to limit medications as much as possible.  Discussion with Dr. Rosemary Holms who feels cardiomyopathy is 2/2 a fib.  We are limited on CCB/BB use due to resting bradycardia. After further discussion, risks of TEE/DCCV reviewed and he agrees to proceed.  No missed doses  of Eliquis.  Continue Eliquis 5 mg twice daily which is appropriate dose for stroke prevention for CHA2DS2-VASc score of 2. I will see him back a few weeks after cardioversion for management of NICM. We will get pre-procedure labs today.   NICM: LVEF 30-35% on TTE 10/12/23 thought to be secondary to atrial fibrillation. He remains very active with daily walking, often getting > 10000 steps daily. He has mild LE edema that has developed since last office visit. No shortness of breath, orthopnea, PND. Weight is stable. HR remains well-controlled.  After lengthy discussion, he would like to proceed with TEE/DCCV as noted above.  I will see him back 2 to 3 weeks after cardioversion to address medical management of NICM.  CAD: Coronary calcium score of 33.8 12/2021. He denies chest pain, dyspnea, or other symptoms concerning for angina.  No indication for further ischemic evaluation at this time.  Need  to address LDL goal of 70 or lower at next office visit.  Hyperlipidemia LDL goal < 70: Not specifically addressed today.  Plan to discuss at next office visit.    Informed Consent   Shared Decision Making/Informed Consent   The risks [stroke, cardiac arrhythmias rarely resulting in the need for a temporary or permanent pacemaker, skin irritation or burns, esophageal damage, perforation (1:10,000 risk), bleeding, pharyngeal hematoma as well as other potential complications associated with conscious sedation including aspiration, arrhythmia, respiratory failure and death], benefits (treatment guidance, restoration of normal sinus rhythm, diagnostic support) and alternatives of a transesophageal echocardiogram guided cardioversion were discussed in detail with Mr. Kampf and he is willing to proceed.     Disposition:4 weeks with me  Signed, Eligha Bridegroom, NP-C

## 2023-10-26 ENCOUNTER — Ambulatory Visit: Payer: Medicare HMO | Attending: Nurse Practitioner | Admitting: Nurse Practitioner

## 2023-10-26 ENCOUNTER — Encounter: Payer: Self-pay | Admitting: Nurse Practitioner

## 2023-10-26 VITALS — BP 128/76 | HR 62 | Ht 75.0 in | Wt 212.8 lb

## 2023-10-26 DIAGNOSIS — I2510 Atherosclerotic heart disease of native coronary artery without angina pectoris: Secondary | ICD-10-CM | POA: Diagnosis not present

## 2023-10-26 DIAGNOSIS — E785 Hyperlipidemia, unspecified: Secondary | ICD-10-CM | POA: Diagnosis not present

## 2023-10-26 DIAGNOSIS — I428 Other cardiomyopathies: Secondary | ICD-10-CM

## 2023-10-26 DIAGNOSIS — I480 Paroxysmal atrial fibrillation: Secondary | ICD-10-CM | POA: Diagnosis not present

## 2023-10-26 DIAGNOSIS — Z7901 Long term (current) use of anticoagulants: Secondary | ICD-10-CM | POA: Diagnosis not present

## 2023-10-26 DIAGNOSIS — I4819 Other persistent atrial fibrillation: Secondary | ICD-10-CM

## 2023-10-26 NOTE — Patient Instructions (Signed)
 Medication Instructions:   Your physician recommends that you continue on your current medications as directed. Please refer to the Current Medication list given to you today.   *If you need a refill on your cardiac medications before your next appointment, please call your pharmacy*   Lab Work:  TODAY!!!! BMET/CBC  If you have labs (blood work) drawn today and your tests are completely normal, you will receive your results only by: MyChart Message (if you have MyChart) OR A paper copy in the mail If you have any lab test that is abnormal or we need to change your treatment, we will call you to review the results.   Testing/Procedures:    Dear Cody Hudson  You are scheduled for a TEE (Transesophageal Echocardiogram) Guided Cardioversion on Thursday, March 13 with Dr. Rennis Golden.  Please arrive at the [facility] (Main Entrance A) at [facility]: [address] [place], [place] [address] at 7:30 AM (This time is 1 hour(s) before your procedure to ensure your preparation).   Free valet parking service is available. You will check in at ADMITTING.   *Please Note: You will receive a call the day before your procedure to confirm the appointment time. That time may have changed from the original time based on the schedule for that day.*    DIET:  Nothing to eat or drink after midnight except a sip of water with medications (see medication instructions below)  MEDICATION INSTRUCTIONS: !!IF ANY NEW MEDICATIONS ARE STARTED AFTER TODAY, PLEASE NOTIFY YOUR PROVIDER AS SOON AS POSSIBLE!!  FYI: Medications such as Semaglutide (Ozempic, [place]), Tirzepatide (Mounjaro, Zepbound), Dulaglutide (Trulicity), etc ("GLP1 agonists") AND Canagliflozin (Invokana), Dapagliflozin (Farxiga), Empagliflozin (Jardiance), Ertugliflozin (Steglatro), Bexagliflozin [employer]) or any combination with one of these drugs such as Invokamet (Canagliflozin/Metformin), Synjardy (Empagliflozin/Metformin), etc ("SGLT2  inhibitors") must be held around the time of a procedure. This is not a comprehensive list of all of these drugs. Please review all of your medications and talk to your provider if you take any one of these. If you are not sure, ask your provider.        :1}Continue taking your anticoagulant (blood thinner): Apixaban (Eliquis).  You will need to continue this after your procedure until you are told by your provider that it is safe to stop.    LABS:   Come to the lab at [facility] at [address] between the hours of 8:00 am and 4:30 pm. You do NOT have to be fasting.  FYI:  For your safety, and to allow [place] to monitor your vital signs accurately during the surgery/procedure we request:  Your support person will be asked to wait in the waiting room during your procedure.  It is OK to have someone drop you off and come back when you are ready to be discharged.  You cannot drive after the procedure and will need someone to drive you home.  Bring your insurance cards.  *Special Note: Every effort is made to have your procedure done on time. Occasionally there are emergencies that occur at the hospital that may cause delays. Please be patient if a delay does occur.       Follow-Up: At [facility], you and your health needs are our priority.  As part of our continuing mission to provide you with exceptional heart care, we have created designated Provider Care Teams.  These Care Teams include your primary Cardiologist (physician) and Advanced Practice Providers (APPs -  Physician  Assistants and Nurse Practitioners) who all work together to provide you with the care you need, when you need it.  We recommend signing up for the patient portal called "MyChart".  Sign up information is provided on this After Visit Summary.  MyChart is used to connect with patients for Virtual Visits (Telemedicine).  Patients are able to view lab/test results, encounter notes, upcoming  appointments, etc.  Non-urgent messages can be sent to your provider as well.   To learn more about what you can do with MyChart, go to [email or web address].    Your next appointment:   5 week(s)  Provider:   Eligha Bridegroom, NP

## 2023-10-27 LAB — BASIC METABOLIC PANEL
BUN/Creatinine Ratio: 16 (ref 10–24)
BUN: 19 mg/dL (ref 8–27)
CO2: 25 mmol/L (ref 20–29)
Calcium: 8.9 mg/dL (ref 8.6–10.2)
Chloride: 106 mmol/L (ref 96–106)
Creatinine, Ser: 1.2 mg/dL (ref 0.76–1.27)
Glucose: 83 mg/dL (ref 70–99)
Potassium: 4.8 mmol/L (ref 3.5–5.2)
Sodium: 143 mmol/L (ref 134–144)
eGFR: 59 mL/min/{1.73_m2} — ABNORMAL LOW (ref >59)

## 2023-10-27 LAB — CBC
Hematocrit: 43.9 % (ref 37.5–51.0)
Hemoglobin: 14.2 g/dL (ref 13.0–17.7)
MCH: 29.2 pg (ref 26.6–33.0)
MCHC: 32.3 g/dL (ref 31.5–35.7)
MCV: 90 fL (ref 79–97)
Platelets: 147 10*3/uL — ABNORMAL LOW (ref 150–450)
RBC: 4.86 x10E6/uL (ref 4.14–5.80)
RDW: 13.1 % (ref 11.6–15.4)
WBC: 5.6 10*3/uL (ref 3.4–10.8)

## 2023-11-04 NOTE — OR Nursing (Signed)
 Called patient with pre-procedure instructions for tomorrow.   Patient informed of:   Time to arrive for procedure. 0700 Remain NPO past midnight.  Must have a ride home and a responsible adult to remain with them for 24 hours post procedure.  Confirmed blood thinner. Confirmed no breaks in taking blood thinner for 3+ weeks prior to procedure. Confirmed patient stopped all GLP-1s and GLP-2s for at least one week before procedure.   Spoke with patients daughter. Confirmed above information.

## 2023-11-05 ENCOUNTER — Ambulatory Visit (HOSPITAL_BASED_OUTPATIENT_CLINIC_OR_DEPARTMENT_OTHER)
Admission: RE | Admit: 2023-11-05 | Discharge: 2023-11-05 | Disposition: A | Discharge status: Home / Self Care | Source: Ambulatory Visit | Attending: Internal Medicine | Admitting: Internal Medicine

## 2023-11-05 ENCOUNTER — Other Ambulatory Visit: Payer: Self-pay

## 2023-11-05 ENCOUNTER — Encounter (HOSPITAL_COMMUNITY)
Admission: RE | Disposition: A | Discharge status: Home / Self Care | Payer: Self-pay | Source: Home / Self Care | Attending: Internal Medicine

## 2023-11-05 ENCOUNTER — Ambulatory Visit (HOSPITAL_COMMUNITY): Admitting: Anesthesiology

## 2023-11-05 ENCOUNTER — Ambulatory Visit (HOSPITAL_BASED_OUTPATIENT_CLINIC_OR_DEPARTMENT_OTHER): Admitting: Anesthesiology

## 2023-11-05 ENCOUNTER — Ambulatory Visit (HOSPITAL_COMMUNITY)
Admission: RE | Admit: 2023-11-05 | Discharge: 2023-11-05 | Disposition: A | Discharge status: Home / Self Care | Attending: Internal Medicine | Admitting: Internal Medicine

## 2023-11-05 DIAGNOSIS — I2510 Atherosclerotic heart disease of native coronary artery without angina pectoris: Secondary | ICD-10-CM | POA: Diagnosis not present

## 2023-11-05 DIAGNOSIS — I480 Paroxysmal atrial fibrillation: Secondary | ICD-10-CM | POA: Insufficient documentation

## 2023-11-05 DIAGNOSIS — M1990 Unspecified osteoarthritis, unspecified site: Secondary | ICD-10-CM

## 2023-11-05 DIAGNOSIS — Z7901 Long term (current) use of anticoagulants: Secondary | ICD-10-CM | POA: Insufficient documentation

## 2023-11-05 DIAGNOSIS — I4891 Unspecified atrial fibrillation: Secondary | ICD-10-CM | POA: Diagnosis not present

## 2023-11-05 DIAGNOSIS — E785 Hyperlipidemia, unspecified: Secondary | ICD-10-CM | POA: Insufficient documentation

## 2023-11-05 DIAGNOSIS — I5022 Chronic systolic (congestive) heart failure: Secondary | ICD-10-CM | POA: Diagnosis not present

## 2023-11-05 DIAGNOSIS — I340 Nonrheumatic mitral (valve) insufficiency: Secondary | ICD-10-CM | POA: Insufficient documentation

## 2023-11-05 DIAGNOSIS — I428 Other cardiomyopathies: Secondary | ICD-10-CM | POA: Insufficient documentation

## 2023-11-05 DIAGNOSIS — Z79899 Other long term (current) drug therapy: Secondary | ICD-10-CM | POA: Diagnosis not present

## 2023-11-05 DIAGNOSIS — I700 Atherosclerosis of aorta: Secondary | ICD-10-CM | POA: Insufficient documentation

## 2023-11-05 DIAGNOSIS — I4819 Other persistent atrial fibrillation: Secondary | ICD-10-CM

## 2023-11-05 HISTORY — PX: CARDIOVERSION: EP1203

## 2023-11-05 HISTORY — PX: TRANSESOPHAGEAL ECHOCARDIOGRAM (CATH LAB): EP1270

## 2023-11-05 LAB — ECHO TEE

## 2023-11-05 SURGERY — TRANSESOPHAGEAL ECHOCARDIOGRAM (TEE) (CATHLAB)
Anesthesia: Monitor Anesthesia Care

## 2023-11-05 MED ORDER — LIDOCAINE 2% (20 MG/ML) 5 ML SYRINGE
INTRAMUSCULAR | Status: DC | PRN
  Administered 2023-11-05: 40 mg via INTRAVENOUS

## 2023-11-05 MED ORDER — PROPOFOL 500 MG/50ML IV EMUL
INTRAVENOUS | Status: DC | PRN
  Administered 2023-11-05: 45 ug/kg/min via INTRAVENOUS

## 2023-11-05 MED ORDER — SODIUM CHLORIDE 0.9% FLUSH
3.0000 mL | Freq: Two times a day (BID) | INTRAVENOUS | Status: DC
Start: 2023-11-05 — End: 2023-11-05

## 2023-11-05 MED ORDER — SODIUM CHLORIDE 0.9% FLUSH: 3.0000 mL | INTRAVENOUS | Status: DC | PRN

## 2023-11-05 MED ORDER — SODIUM CHLORIDE 0.9 % IV SOLN: INTRAVENOUS | Status: DC | PRN

## 2023-11-05 MED ORDER — PHENYLEPHRINE HCL-NACL 20-0.9 MG/250ML-% IV SOLN
INTRAVENOUS | Status: DC | PRN
  Administered 2023-11-05: 25 ug/min via INTRAVENOUS

## 2023-11-05 MED ORDER — PROPOFOL 10 MG/ML IV BOLUS
INTRAVENOUS | Status: DC | PRN
  Administered 2023-11-05: 50 mg via INTRAVENOUS

## 2023-11-05 SURGICAL SUPPLY — 1 items: PAD DEFIB RADIO PHYSIO CONN (PAD) ×1 IMPLANT

## 2023-11-05 NOTE — Interval H&P Note (Signed)
 History and Physical Interval Note:  11/05/2023 8:04 AM  Cody Hudson  has presented today for surgery, with the diagnosis of afib.  The various methods of treatment have been discussed with the patient and family. After consideration of risks, benefits and other options for treatment, the patient has consented to  Procedure(s): TRANSESOPHAGEAL ECHOCARDIOGRAM (N/A) CARDIOVERSION (N/A) as a surgical intervention.  The patient's history has been reviewed, patient examined, no change in status, stable for surgery.  I have reviewed the patient's chart and labs.  Questions were answered to the patient's satisfaction.     Cody Hudson

## 2023-11-05 NOTE — Progress Notes (Signed)
  Echocardiogram Echocardiogram Transesophageal has been performed.  Cody Hudson 11/05/2023, 8:35 AM

## 2023-11-05 NOTE — CV Procedure (Signed)
 TEE/CARDIOVERSION NOTE  TRANSESOPHAGEAL ECHOCARDIOGRAM (TEE):  Indictation: Atrial Fibrillation  Consent:   Informed consent was obtained prior to the procedure. The risks, benefits and alternatives for the procedure were discussed and the patient comprehended these risks.  Risks include, but are not limited to, cough, sore throat, vomiting, nausea, somnolence, esophageal and stomach trauma or perforation, bleeding, low blood pressure, aspiration, pneumonia, infection, trauma to the teeth and death.    Time Out: Verified patient identification, verified procedure, site/side was marked, verified correct patient position, special equipment/implants available, medications/allergies/relevent history reviewed, required imaging and test results available. Performed  Procedure:  After a procedural time-out, the patient was given propofol per anesthesia for sedation. See their separate report for details. The patient's heart rate, blood pressure, and oxygen saturation are monitored continuously during the procedure. The oropharynx was anesthetized with topical cetacaine.  The transesophageal probe was inserted in the esophagus and stomach without difficulty and multiple views were obtained. Agitated microbubble saline contrast was not administered.  Complications:    Complications: None Patient did tolerate procedure well.  Findings:  LEFT VENTRICLE: The left ventricular wall thickness is mildly increased.  The left ventricular cavity is mildly dilated in size. Wall motion is globally hypokinetic.  LVEF is 35-40%.  RIGHT VENTRICLE:  The right ventricle is normal in structure and function without any thrombus or masses.    LEFT ATRIUM:  The left atrium is severely in size without any thrombus or masses.  There is spontaneous echo contrast ("smoke") in the left atrium consistent with a low flow state.  LEFT ATRIAL APPENDAGE:  The large left atrial appendage is free of any thrombus or  masses. The appendage has single lobes. Pulse doppler indicates low flow in the appendage.  ATRIAL SEPTUM:  The atrial septum appears intact and is free of thrombus and/or masses.  There is no evidence for interatrial shunting by color doppler. There is left to right bowing suggestive of elevated left atrial pressure.  RIGHT ATRIUM:  The right atrium is severely dilated without any thrombus or masses.  MITRAL VALVE:  The mitral valve is normal in structure and function with  mild to moderate  regurgitation.  There were no vegetations or stenosis.  AORTIC VALVE:  The aortic valve is trileaflet, normal in structure and function with  no  regurgitation.  There were no vegetations or stenosis  TRICUSPID VALVE:  The tricuspid valve is normal in structure and function with Mild regurgitation.  There were no vegetations or stenosis   PULMONIC VALVE:  The pulmonic valve is normal in structure and function with  trivial  regurgitation.  There were no vegetations or stenosis.   AORTIC ARCH, ASCENDING AND DESCENDING AORTA:  There was grade 2 Myrtis Ser et. Al, 1992) atherosclerosis of the proximal descending aorta.  12. PULMONARY VEINS: Anomalous pulmonary venous return was not noted.  13. PERICARDIUM: The pericardium appeared normal and non-thickened.  There is no pericardial effusion.  CARDIOVERSION:     Second Time Out: Verified patient identification, verified procedure, site/side was marked, verified correct patient position, special equipment/implants available, medications/allergies/relevent history reviewed, required imaging and test results available.  Performed  Procedure:  Patient placed on cardiac monitor, pulse oximetry, supplemental oxygen as necessary.  Sedation administered per anesthesia Pacer pads placed anterior and posterior chest. Cardioverted 1 time(s).  Cardioverted at 300J biphasic.  Complications:  Complications: None Patient did tolerate procedure  well.  Impression:  No LAA thrombus Negative for PFO Severe biatrial enlargement Mild to moderate  functional MR LVEF 35-40%, global hypokinesis Successful DCCV with a single 300J Biphasic shock  Recommendations:   Follow-up as scheduled with Eligha Bridegroom, NP.  Time Spent Directly with the Patient:  45 minutes   Chrystie Nose, MD, [facility], FACP  [place]  [facility]  Medical Director of the [facility] Lipid [facility] &  Cardiovascular Risk Reduction Clinic Diplomate of the American Board of Clinical Lipidology Attending Cardiologist  Direct Dial: [phone]  Fax: [phone]  Website:  [email or web address] Raveena Hebdon 11/05/2023, 8:27 AM

## 2023-11-05 NOTE — Transfer of Care (Signed)
 Immediate Anesthesia Transfer of Care Note  Patient: Cody Hudson  Procedure(s) Performed: TRANSESOPHAGEAL ECHOCARDIOGRAM CARDIOVERSION  Patient Location: PACU  Anesthesia Type:MAC  Level of Consciousness: awake and alert   Airway & Oxygen Therapy: Patient Spontanous Breathing  Post-op Assessment: Report given to RN  Post vital signs: Reviewed and stable  Last Vitals:  Vitals Value Taken Time  BP 103/75   Temp    Pulse 45 11/05/23 0839  Resp 14 11/05/23 0839  SpO2 99 % 11/05/23 0839  Vitals shown include unfiled device data.  Last Pain:  Vitals:   11/05/23 0720  TempSrc:   PainSc: 0-No pain         Complications: No notable events documented.

## 2023-11-05 NOTE — Anesthesia Preprocedure Evaluation (Addendum)
 Anesthesia Evaluation  Patient identified by MRN, date of birth, ID band Patient awake    Reviewed: Allergy & Precautions, NPO status , Patient's Chart, lab work & pertinent test results  Airway Mallampati: I  TM Distance: >3 FB Neck ROM: Full    Dental  (+) Dental Advisory Given, Chipped,    Pulmonary neg pulmonary ROS   Pulmonary exam normal breath sounds clear to auscultation       Cardiovascular Normal cardiovascular exam+ dysrhythmias Atrial Fibrillation + Valvular Problems/Murmurs (mild/mod MR) MR  Rhythm:Irregular Rate:Normal  TTE 2025 1. Left ventricular ejection fraction, by estimation, is 30 to 35%. The  left ventricle has moderately decreased function. The left ventricle  demonstrates global hypokinesis. There is mild concentric left ventricular  hypertrophy. Left ventricular  diastolic function could not be evaluated.   2. Right ventricular systolic function is normal. The right ventricular  size is mildly enlarged. There is normal pulmonary artery systolic  pressure. The estimated right ventricular systolic pressure is 34.6 mmHg.   3. Left atrial size was severely dilated.   4. Right atrial size was severely dilated.   5. The mitral valve is normal in structure. Mild to moderate mitral valve  regurgitation. No evidence of mitral stenosis.   6. Tricuspid valve regurgitation is mild to moderate.   7. The aortic valve is tricuspid. There is mild calcification of the  aortic valve. Aortic valve regurgitation is not visualized. No aortic  stenosis is present.   8. Aortic dilatation noted. There is mild dilatation of the aortic root,  measuring 40 mm.   9. The inferior vena cava is dilated in size with >50% respiratory  variability, suggesting right atrial pressure of 8 mmHg.     Neuro/Psych negative neurological ROS  negative psych ROS   GI/Hepatic negative GI ROS, Neg liver ROS,,,  Endo/Other  negative endocrine  ROS    Renal/GU negative Renal ROS  negative genitourinary   Musculoskeletal  (+) Arthritis ,    Abdominal   Peds  Hematology negative hematology ROS (+)   Anesthesia Other Findings   Reproductive/Obstetrics                             Anesthesia Physical Anesthesia Plan  ASA: 3  Anesthesia Plan: MAC   Post-op Pain Management:    Induction: Intravenous  PONV Risk Score and Plan: 1 and Propofol infusion and Treatment may vary due to age or medical condition  Airway Management Planned: Natural Airway  Additional Equipment:   Intra-op Plan:   Post-operative Plan:   Informed Consent: I have reviewed the patients History and Physical, chart, labs and discussed the procedure including the risks, benefits and alternatives for the proposed anesthesia with the patient or authorized representative who has indicated his/her understanding and acceptance.     Dental advisory given  Plan Discussed with: CRNA  Anesthesia Plan Comments:        Anesthesia Quick Evaluation

## 2023-11-05 NOTE — Anesthesia Postprocedure Evaluation (Signed)
 Anesthesia Post Note  Patient: Cody Hudson  Procedure(s) Performed: TRANSESOPHAGEAL ECHOCARDIOGRAM CARDIOVERSION     Patient location during evaluation: PACU Anesthesia Type: MAC Level of consciousness: awake and alert Pain management: pain level controlled Vital Signs Assessment: post-procedure vital signs reviewed and stable Respiratory status: spontaneous breathing, nonlabored ventilation, respiratory function stable and patient connected to nasal cannula oxygen Cardiovascular status: stable and blood pressure returned to baseline Postop Assessment: no apparent nausea or vomiting Anesthetic complications: no  No notable events documented.  Last Vitals:  Vitals:   11/05/23 0850 11/05/23 0900  BP: 104/67 107/69  Pulse: (!) 59 (!) 51  Resp: 15 15  Temp:    SpO2: 90% (!) 87%    Last Pain:  Vitals:   11/05/23 0839  TempSrc: Temporal  PainSc: 0-No pain                 Dantrell Schertzer L Guillaume Weninger

## 2023-11-06 ENCOUNTER — Encounter (HOSPITAL_COMMUNITY): Payer: Self-pay | Admitting: Internal Medicine

## 2023-11-23 NOTE — Progress Notes (Unsigned)
 Cardiology Office Note:  .   Date:  11/26/2023  ID:  Cody Hudson, DOB 1934/05/01, MRN [id number] PCP: Geoffry Paradise, MD  [facility] Providers Cardiologist:  None    Patient Profile: .      PMH Paroxysmal atrial tachycardia Paroxysmal atrial fibrillation on chronic anticoagulation HFrEF Coronary artery disease CT Calcium score 12/24/2021 CAC 33.8  LM 33.8, LAD 0, LCx 0, RCA 0 Hyperlipidemia  He presented to [facility] cardiology 12/11/2021 for evaluation of chest pain which he described as "gas-like" sensation.  Discomfort present on and off during the day.  It seems to get worse with deep breathing as well as walking inside the house but improves with rest.  Worsening episodes only last for few seconds.  He remains active after retirement from [employer].  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  He has been athletic all his life and ran until 4 years ago.  He continues to walk and had walked up to 10,000 steps on trails just prior to initial visit.  He reported 20 years prior he had an episode of dehydration and lightheadedness while working on a roof.  At that time he was seen by cardiologist and found to have atrial fibrillation.  His atrial fibrillation was attributed to his deep hydration and he was never on anticoagulation.  No recurrence to his awareness.  Echo 11/19/2021 revealed normal LVEF, G1 DD, mild prolapse of mitral leaflets, mild to moderate AR, mild pulmonic regurgitation.  Cardiac monitor completed 12/04/2021 revealed 198 episodes of probable atrial tachycardia longest lasting for 19 beats, no atrial fibrillation/flutter/VT/high-grade AV block.  Exercise nuclear stress test 11/2021 was low risk.  At follow-up visit with Dr. Rosemary Holms 04/09/2022, he reported no recurrence of chest pain.  No AV nodal blocker agents were started due to resting heart rate in the 50s. CT calcium score 12/2021 revealed mildly elevated CAC score  of 33.8 with the bulk of calcification in the left main.  He was advised to start rosuvastatin 10 mg daily, but it appears he did not wish to do so.  Seen by Dr. Rosemary Holms 09/21/2023 with recent finding of A-fib on EKG during PCP visit. Was also found to have CAP of the right lower lobe treated with antibiotics.  Up until December 2024 he was walking 5 to 6 miles daily with no concerning cardiac symptoms. His daughter had noted he gets very tired towards the end of the day recently. Endorsed snoring but wakes up feeling refreshed. No other symptoms of A-fib with exception of fatigue.  Eliquis 5 mg twice daily was recommended for stroke prevention for CHA2DS2-VASc score of 2. Echo repeateded 10/12/23 and unfortunately revealed reduced LVEF 30 to 35%, mildly enlarged RV, severe biatrial enlargement, mild to moderate MR, mild calcification of the aortic valve with no stenosis, mild dilatation of the aortic root measuring 40 mm.  Seen in clinic by me on 10/26/23 for follow-up of a fib and newly reduced LVEF, accompanied by his daughter. EKG revealed a fib at 62 bpm. Reported generally feeling well and continues to paint for leisure and walk daily for exercise. Admitted to intermittent dizziness and new onset leg swelling. HR is "all over the place."  No  shortness of breath, orthopnea, PND, chest pain, presyncope or syncope. Noted decrease in circulation in his hands, but no significant discomfort. No missed doses of Eliquis. Expresses a preference for avoiding additional medications and procedures if possible, but is open to considering  options for managing a fib. Through shared decision making, we decided to pursued TEE/DCCV.  He underwent successful DCCV on 11/05/23. TEE revealed LVEF 35-40%, normal RV, severe bilateral atrial enlargement, no thrombus, no significant valve disease.        History of Present Illness: .   Cody Hudson is a very pleasant 89 y.o. male who is here for follow-up of PAF. She is  currently in sinus rhythm post-cardioversion. He reports occasional fluctuations in heart rate, but no associated symptoms such as chest discomfort, shortness of breath, or swelling in the legs. The patient is on Eliquis for stroke prevention and has noticed increased bruising and bleeding, including a recent incident where a heavy object fell on his hand causing significant bruising. Despite this, he remains active, walking three times a day. The patient's main concern is a perceived decline in cognitive function, specifically difficulty with memory and retrieval of thoughts, which he reports has been occurring over the past year.   Discussed the use of AI scribe software for clinical note transcription with the patient, who gave verbal consent to proceed.   ROS: See HPI       Studies Reviewed: Marland Kitchen   EKG Interpretation Date/Time:  Thursday November 26 2023 09:48:43 EDT Ventricular Rate:  48 PR Interval:  226 QRS Duration:  90 QT Interval:  434 QTC Calculation: 387 R Axis:   25  Text Interpretation: Sinus bradycardia with 1st degree A-V block with Premature supraventricular complexes When compared with ECG of 05-Nov-2023 08:28, Premature supraventricular complexes are now Present Confirmed by Eligha Bridegroom [phone]) on 11/26/2023 9:55:00 AM    Risk Assessment/Calculations:    CHA2DS2-VASc Score = 2   This indicates a 2.2% annual risk of stroke. The patient's score is based upon: CHF History: 0 HTN History: 0 Diabetes History: 0 Stroke History: 0 Vascular Disease History: 0 Age Score: 2 Gender Score: 0            Physical Exam:   VS:  BP 120/64 (BP Location: Right Arm, Patient Position: Sitting, Cuff Size: Normal)   Pulse (!) 48   Ht 6\' 3"  (1.905 m)   Wt 207 lb (93.9 kg)   SpO2 98%   BMI 25.87 kg/m    Wt Readings from Last 3 Encounters:  11/26/23 207 lb (93.9 kg)  10/26/23 212 lb 12.8 oz (96.5 kg)  09/21/23 208 lb 9.6 oz (94.6 kg)    GEN: Well nourished, well developed in  no acute distress NECK: No JVD; No carotid bruits CARDIAC: Irregular RR, no murmurs, rubs, gallops RESPIRATORY:  Clear to auscultation without rales, wheezing or rhonchi  ABDOMEN: Soft, non-tender, non-distended EXTREMITIES:  No edema; No deformity     ASSESSMENT AND PLAN: .    PAF on chronic anticoagulation: EKG today reveals sinus bradycardia at 48 bpm, 1st degree AV block with PVCs.  He is feeling well.  He is not on AV nodal blocking agents in the setting of SB. He denies lightheadedness, presyncope, syncope.  Occasionally notices elevations in HR but this is not consistent.  Advised him to notify us for consistently elevated HR as this may signify return of A-fib.  No missed doses of Eliquis. He has mild bruising to his hands, no significant concerns with bleeding. Continue Eliquis 5 mg twice daily which is appropriate dose for stroke prevention for CHA2DS2-VASc score of 2.   Sinus bradycardia/1st degree AV block: EKG today reveals sinus bradycardia at 48 bpm, first-degree AV block with frequent PVCs. He  is asymptomatic. Notes elevations in HR with walking which would be appropriate chronotropic response. No AV nodal blocking agents. Advised him to notify us if he develops symptoms of fatigue, lightheadedness, presyncope or syncope.   NICM: LVEF 30-35% on TTE 10/12/23 thought to be secondary to atrial fibrillation.  He underwent ESWL cardioversion on 11/05/2023. HR is well controlled, generally in the 50s. He remains very active with daily walking, often getting > 10000 steps daily. Mild LE edema persists despite restoration of NSR. No shortness of breath, orthopnea, PND. Weight is stable.  We will get repeat echo to evaluate heart function since NSR has been restored.   CAD: Coronary calcium score of 33.8 12/2021. He denies chest pain, dyspnea, or other symptoms concerning for angina.  No indication for further ischemic evaluation at this time.   Hyperlipidemia LDL goal < 70: No recent lipid  panel to review. Advised that goal LDL is < 70. Last LDL 104 in August 2023. We had a brief discussion about benefit of statin therapy given his vibrancy. He plans to see PCP soon and will discuss with him.     Disposition:6 months with Dr. Rosemary Holms  Signed, Eligha Bridegroom, NP-C

## 2023-11-26 ENCOUNTER — Encounter: Payer: Self-pay | Admitting: Nurse Practitioner

## 2023-11-26 ENCOUNTER — Ambulatory Visit: Attending: Nurse Practitioner | Admitting: Nurse Practitioner

## 2023-11-26 VITALS — BP 120/64 | HR 48 | Ht 75.0 in | Wt 207.0 lb

## 2023-11-26 DIAGNOSIS — R001 Bradycardia, unspecified: Secondary | ICD-10-CM | POA: Diagnosis not present

## 2023-11-26 DIAGNOSIS — Z7901 Long term (current) use of anticoagulants: Secondary | ICD-10-CM

## 2023-11-26 DIAGNOSIS — I440 Atrioventricular block, first degree: Secondary | ICD-10-CM | POA: Diagnosis not present

## 2023-11-26 DIAGNOSIS — I2510 Atherosclerotic heart disease of native coronary artery without angina pectoris: Secondary | ICD-10-CM

## 2023-11-26 DIAGNOSIS — I480 Paroxysmal atrial fibrillation: Secondary | ICD-10-CM

## 2023-11-26 DIAGNOSIS — E785 Hyperlipidemia, unspecified: Secondary | ICD-10-CM

## 2023-11-26 DIAGNOSIS — I428 Other cardiomyopathies: Secondary | ICD-10-CM

## 2023-11-26 NOTE — Patient Instructions (Signed)
 Medication Instructions:   Your physician recommends that you continue on your current medications as directed. Please refer to the Current Medication list given to you today.   *If you need a refill on your cardiac medications before your next appointment, please call your pharmacy*  Lab Work:  None ordered.  If you have labs (blood work) drawn today and your tests are completely normal, you will receive your results only by: MyChart Message (if you have MyChart) OR A paper copy in the mail If you have any lab test that is abnormal or we need to change your treatment, we will call you to review the results.  Testing/Procedures:  Your physician has requested that you have an echocardiogram. Echocardiography is a painless test that uses sound waves to create images of your heart. It provides your doctor with information about the size and shape of your heart and how well your heart's chambers and valves are working. This procedure takes approximately one hour. There are no restrictions for this procedure. Please do NOT wear cologne, aftershave, or lotions (deodorant is allowed). Please arrive 15 minutes prior to your appointment time.   Follow-Up: At [facility], you and your health needs are our priority.  As part of our continuing mission to provide you with exceptional heart care, our providers are all part of one team.  This team includes your primary Cardiologist (physician) and Advanced Practice Providers or APPs (Physician Assistants and Nurse Practitioners) who all work together to provide you with the care you need, when you need it.  Your next appointment:   6 month(s)  Provider:   Dr. Rosemary Holms  We recommend signing up for the patient portal called "MyChart".  Sign up information is provided on this After Visit Summary.  MyChart is used to connect with patients for Virtual Visits (Telemedicine).  Patients are able to view lab/test results, encounter notes,  upcoming appointments, etc.  Non-urgent messages can be sent to your provider as well.   To learn more about what you can do with MyChart, go to [email or web address].   Other Instructions  Your physician wants you to follow-up in: 6 months.  You will receive a reminder letter in the mail two months in advance. If you don't receive a letter, please call our office to schedule the follow-up appointment.       1st Floor: - Lobby - Registration  - Pharmacy  - Lab - Cafe  2nd Floor: - PV Lab - Diagnostic Testing (echo, CT, nuclear med)  3rd Floor: - Vacant  4th Floor: - TCTS (cardiothoracic surgery) - AFib Clinic - Structural Heart Clinic - Vascular Surgery  - Vascular Ultrasound  5th Floor: - HeartCare Cardiology (general and EP) - Clinical Pharmacy for coumadin, hypertension, lipid, weight-loss medications, and med management appointments    Valet parking services will be available as well.

## 2023-12-21 DIAGNOSIS — H9202 Otalgia, left ear: Secondary | ICD-10-CM | POA: Diagnosis not present

## 2023-12-21 DIAGNOSIS — I4891 Unspecified atrial fibrillation: Secondary | ICD-10-CM | POA: Diagnosis not present

## 2023-12-21 DIAGNOSIS — H672 Otitis media in diseases classified elsewhere, left ear: Secondary | ICD-10-CM | POA: Diagnosis not present

## 2023-12-27 IMAGING — CT CT CARDIAC CORONARY ARTERY CALCIUM SCORE
3 series · 14 of 20 positions shown, 16 images · non-contrast
Comparison: None Available.

CLINICAL DATA: 87-year-old white male

EXAM:
CT CARDIAC CORONARY ARTERY CALCIUM SCORE
TECHNIQUE: Non-contrast imaging through the heart was performed using
prospective ECG gating. Image post processing was performed on an
independent workstation, allowing for quantitative analysis of the
heart and coronary arteries. Note that this exam targets the heart
and the chest was not imaged in its entirety.

[Series 2: calcium scoring 2.00 qr36 bestdiast 73% hrt calciu · axial · 0.41mm/px · z∈[+1624,+1720]mm · 4 of 80 slices shown]
[im 16/80  vessel]
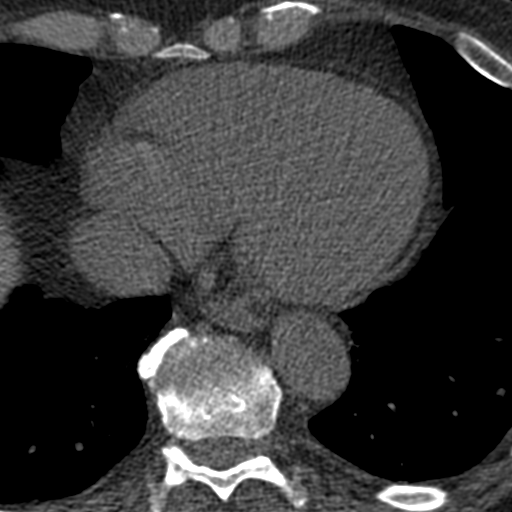
[im 32/80  vessel]
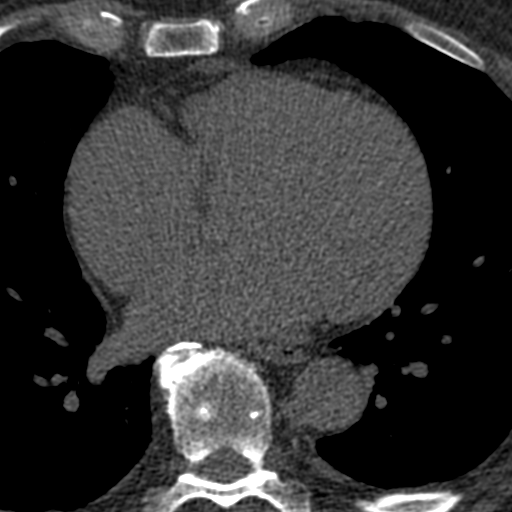
[im 48/80  vessel]
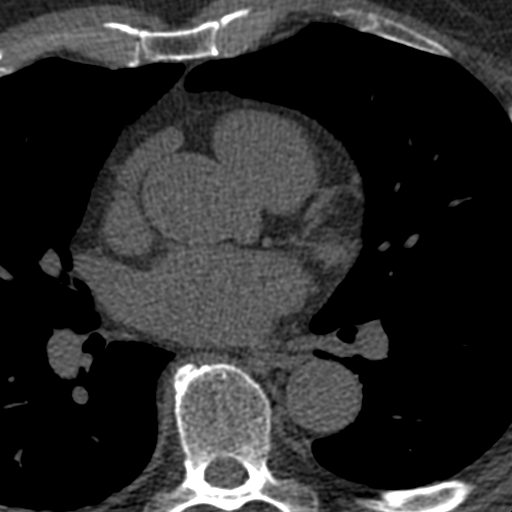
[im 64/80  vessel]
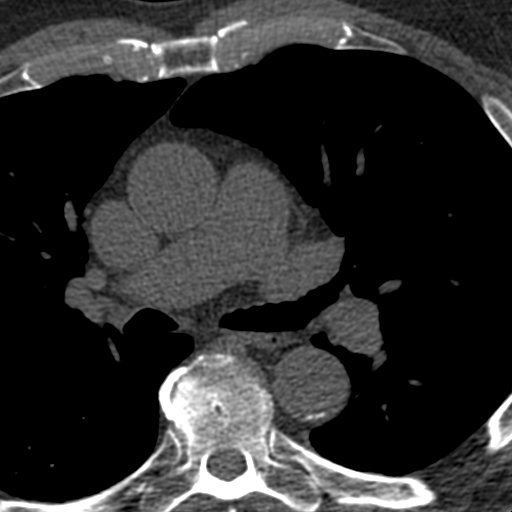

[Series 3: calcium scoring 2.00 br40 bestdiast 73% axial · axial · 0.67mm/px · z∈[+1622,+1726]mm · 5 of 79 slices shown, 7 images]
[im 14/79  vessel]
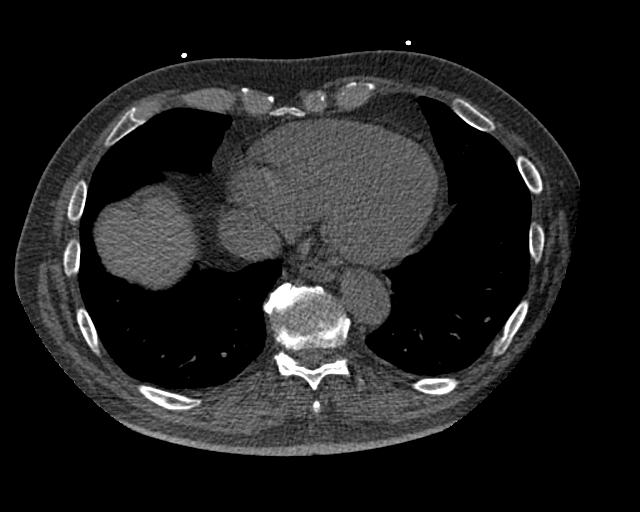
[im 14/79  lung]
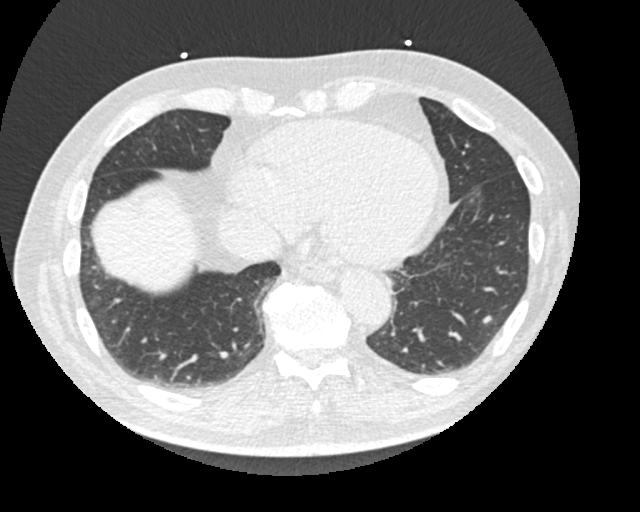
[im 27/79  vessel]
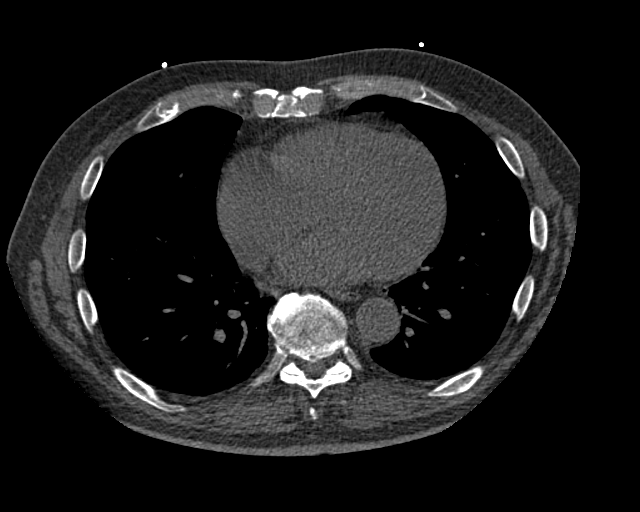
[im 40/79  vessel]
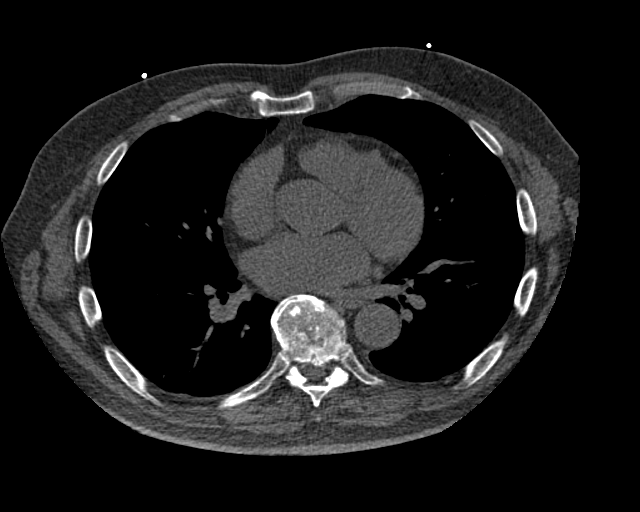
[im 53/79  vessel]
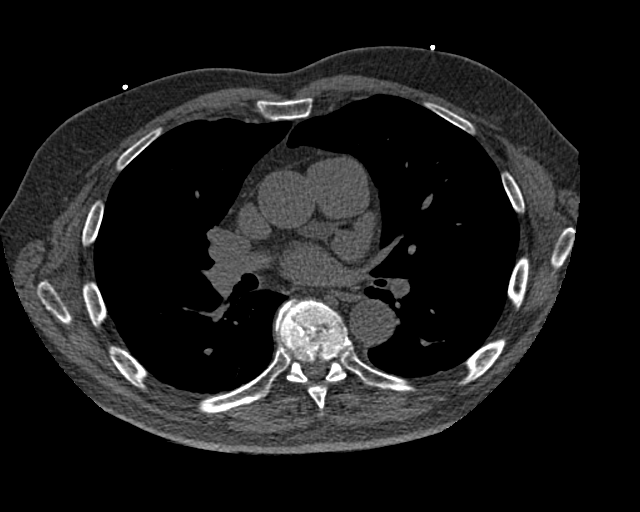
[im 66/79  vessel]
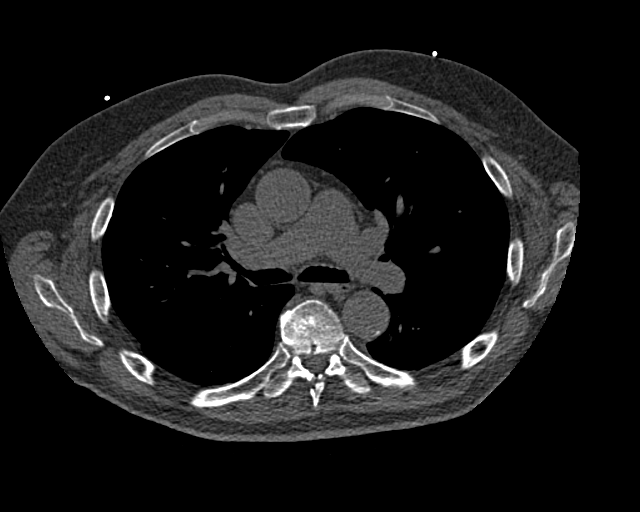
[im 66/79  lung]
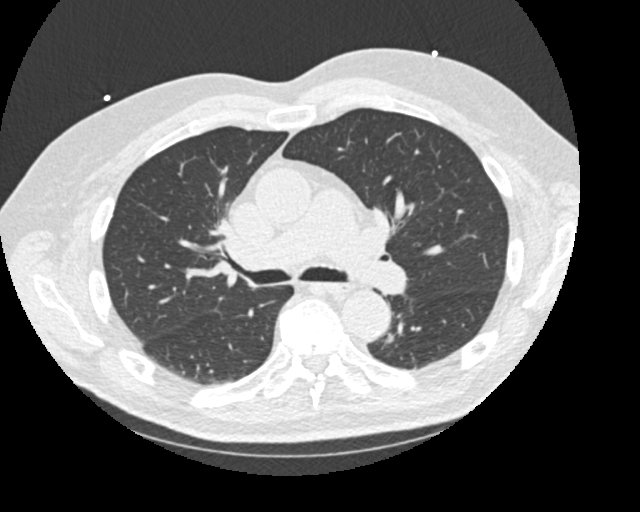

[Series 9: calcium scoring 2.00 br60 bestdiast 73% lungs · axial · 0.67mm/px · z∈[+1622,+1726]mm · 5 of 79 slices shown]
[im 14/79  vessel]
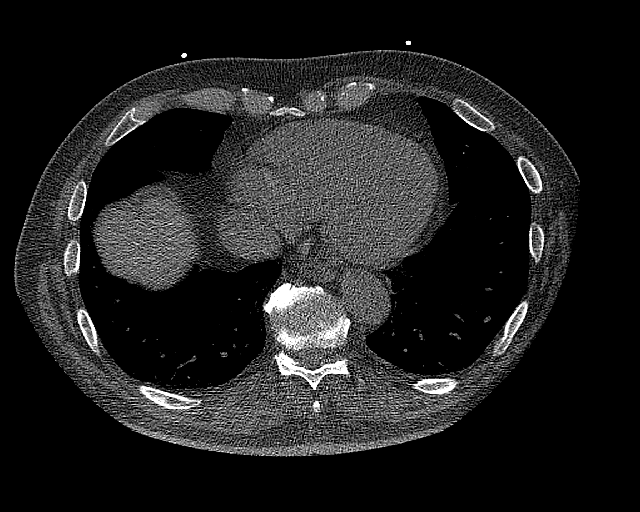
[im 27/79  vessel]
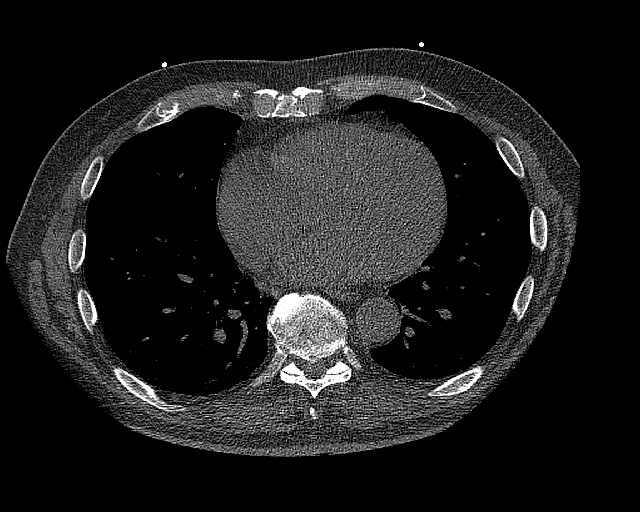
[im 40/79  vessel]
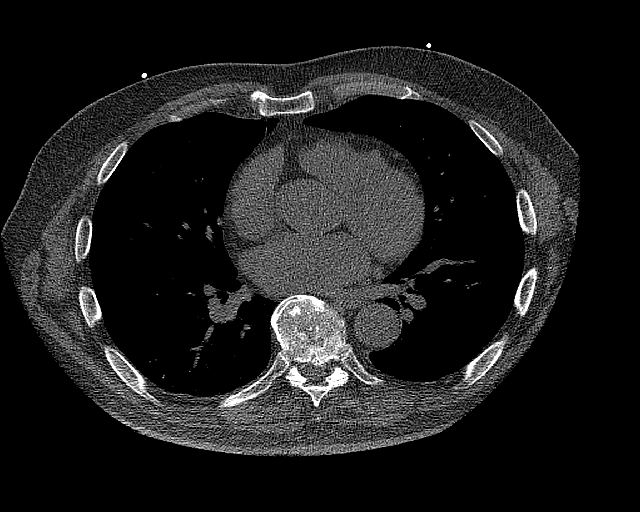
[im 53/79  vessel]
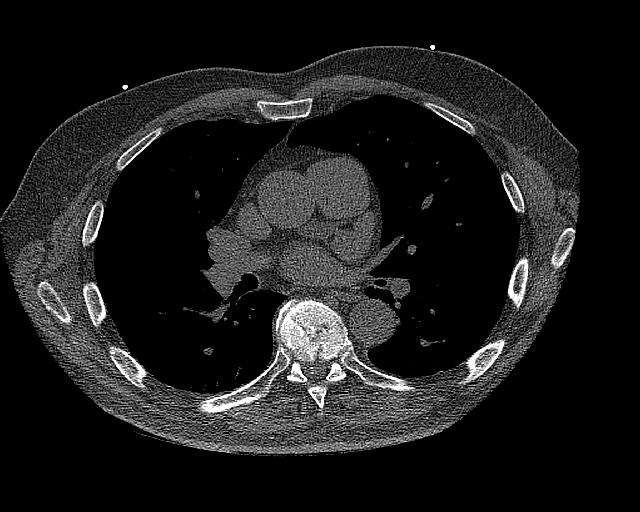
[im 66/79  vessel]
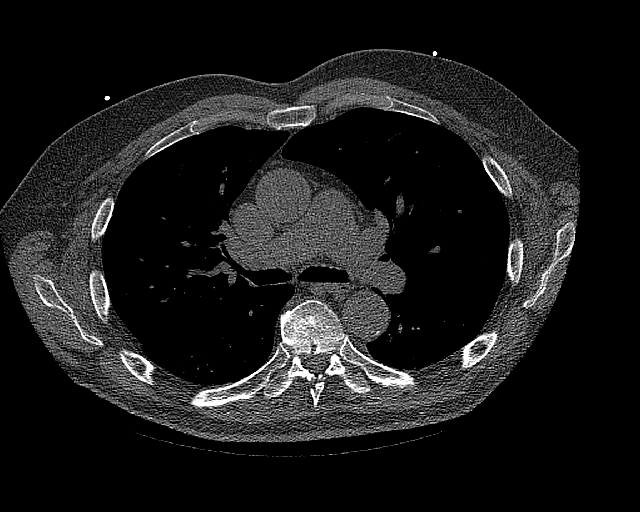

[14 of 20 positions shown; findings below may reference images not displayed]

FINDINGS: CORONARY CALCIUM SCORES:

Left Main:

LAD: 0

LCx: 0

RCA: 0

Total Agatston Score:

[HOSPITAL] percentile: Age out of range

AORTA MEASUREMENTS:

Ascending Aorta: 36 mm

Descending Aorta: 32 mm

OTHER FINDINGS:

Vascular: Normal heart size. No pericardial effusion. Normal caliber
thoracic aorta with mild calcified plaque.

Mediastinum/Nodes: Esophagus is unremarkable. No pathologically
enlarged lymph nodes seen in the chest.

Lungs/Pleura: Central airways are patent. Bibasilar atelectasis. No
consolidation, pleural effusion or pneumothorax.

Upper Abdomen: No acute abnormality.

Musculoskeletal: No chest wall mass or suspicious bone lesions
identified.
IMPRESSION: 1. Total Agatston Score:
2.  Aortic Atherosclerosis (FV220-DOI.I).

## 2024-01-05 ENCOUNTER — Other Ambulatory Visit (HOSPITAL_COMMUNITY)

## 2024-01-07 ENCOUNTER — Ambulatory Visit (HOSPITAL_COMMUNITY)
Admission: RE | Admit: 2024-01-07 | Discharge: 2024-01-07 | Disposition: A | Discharge status: Home / Self Care | Source: Ambulatory Visit | Attending: Cardiovascular Disease | Admitting: Cardiovascular Disease

## 2024-01-07 DIAGNOSIS — I5030 Unspecified diastolic (congestive) heart failure: Secondary | ICD-10-CM | POA: Diagnosis not present

## 2024-01-07 DIAGNOSIS — I3139 Other pericardial effusion (noninflammatory): Secondary | ICD-10-CM | POA: Diagnosis not present

## 2024-01-07 DIAGNOSIS — I480 Paroxysmal atrial fibrillation: Secondary | ICD-10-CM | POA: Insufficient documentation

## 2024-01-07 DIAGNOSIS — I428 Other cardiomyopathies: Secondary | ICD-10-CM | POA: Diagnosis not present

## 2024-01-07 DIAGNOSIS — I340 Nonrheumatic mitral (valve) insufficiency: Secondary | ICD-10-CM

## 2024-01-07 LAB — ECHOCARDIOGRAM COMPLETE
Area-P 1/2: 2.17 cm2
S' Lateral: 3.5 cm

## 2024-01-08 ENCOUNTER — Ambulatory Visit (HOSPITAL_BASED_OUTPATIENT_CLINIC_OR_DEPARTMENT_OTHER): Payer: Self-pay | Admitting: Nurse Practitioner

## 2024-01-20 DIAGNOSIS — N401 Enlarged prostate with lower urinary tract symptoms: Secondary | ICD-10-CM | POA: Diagnosis not present

## 2024-01-20 DIAGNOSIS — R1031 Right lower quadrant pain: Secondary | ICD-10-CM | POA: Diagnosis not present

## 2024-01-20 DIAGNOSIS — W19XXXA Unspecified fall, initial encounter: Secondary | ICD-10-CM | POA: Diagnosis not present

## 2024-01-20 DIAGNOSIS — M25512 Pain in left shoulder: Secondary | ICD-10-CM | POA: Diagnosis not present

## 2024-01-20 DIAGNOSIS — M533 Sacrococcygeal disorders, not elsewhere classified: Secondary | ICD-10-CM | POA: Diagnosis not present

## 2024-02-25 ENCOUNTER — Ambulatory Visit (INDEPENDENT_AMBULATORY_CARE_PROVIDER_SITE_OTHER): Admitting: Sports Medicine

## 2024-02-25 ENCOUNTER — Other Ambulatory Visit: Payer: Self-pay

## 2024-02-25 ENCOUNTER — Encounter: Payer: Self-pay | Admitting: Sports Medicine

## 2024-02-25 ENCOUNTER — Ambulatory Visit (INDEPENDENT_AMBULATORY_CARE_PROVIDER_SITE_OTHER): Payer: Self-pay

## 2024-02-25 ENCOUNTER — Ambulatory Visit: Admitting: Orthopaedic Surgery

## 2024-02-25 DIAGNOSIS — G8929 Other chronic pain: Secondary | ICD-10-CM

## 2024-02-25 DIAGNOSIS — M25512 Pain in left shoulder: Secondary | ICD-10-CM | POA: Diagnosis not present

## 2024-02-25 MED ORDER — BUPIVACAINE HCL 0.25 % IJ SOLN
2.0000 mL | INTRAMUSCULAR | Status: AC | PRN
  Administered 2024-02-25: 2 mL via INTRA_ARTICULAR

## 2024-02-25 MED ORDER — METHYLPREDNISOLONE ACETATE 40 MG/ML IJ SUSP
40.0000 mg | INTRAMUSCULAR | Status: AC | PRN
  Administered 2024-02-25: 40 mg via INTRA_ARTICULAR

## 2024-02-25 MED ORDER — LIDOCAINE HCL 1 % IJ SOLN
2.0000 mL | INTRAMUSCULAR | Status: AC | PRN
Start: 2024-02-25 — End: 2024-02-25
  Administered 2024-02-25: 2 mL

## 2024-02-25 NOTE — Progress Notes (Signed)
   Procedure Note  Patient: Cody Hudson             Date of Birth: 10/29/1933           MRN: [id number]             Visit Date: 02/25/2024  Procedures: Visit Diagnoses:  1. Left shoulder pain, unspecified chronicity    Large Joint Inj: L glenohumeral on 02/25/2024 10:26 AM Indications: pain Details: 22 G 3.5 in needle, ultrasound-guided posterior approach Medications: 2 mL lidocaine  1 %; 2 mL bupivacaine 0.25 %; 40 mg methylPREDNISolone acetate 40 MG/ML Outcome: tolerated well, no immediate complications  US -guided glenohumeral joint injection, left shoulder After discussion on risks/benefits/indications, informed verbal consent was obtained. A timeout was then performed. The patient was positioned lying lateral recumbent on examination table. The patient's shoulder was prepped with betadine and multiple alcohol  swabs and utilizing ultrasound guidance, the patient's glenohumeral joint was identified on ultrasound. Using ultrasound guidance a 22-gauge, 3.5 inch needle with a mixture of 2:2:1 cc's lidocaine :bupivicaine:depomedrol was directed from a lateral to medial direction via in-plane technique into the glenohumeral joint with visualization of appropriate spread of injectate into the joint. Patient tolerated the procedure well without immediate complications.      Procedure, treatment alternatives, risks and benefits explained, specific risks discussed. Consent was given by the patient. Immediately prior to procedure a time out was called to verify the correct patient, procedure, equipment, support staff and site/side marked as required. Patient was prepped and draped in the usual sterile fashion.     - patient tolerated procedure well, discussed post-injection protocol - follow-up with Dr. Jerri as indicated; I am happy to see them as needed  Lonell Sprang, DO Primary Care Sports Medicine Physician  [facility]  This note was dictated using Dragon naturally  speaking software and may contain errors in syntax, spelling, or content which have not been identified prior to signing this note.

## 2024-02-26 NOTE — Progress Notes (Signed)
 Office Visit Note   Patient: Cody Hudson           Date of Birth: [birth year]           MRN: [id number] Visit Date: 02/25/2024              Requested by: Shepard Ade, MD [address] [place],  [place] [address] PCP: Shepard Ade, MD   Assessment & Plan: Visit Diagnoses:  1. Left shoulder pain, unspecified chronicity     Plan: History of Present Illness Cody Hudson is an 89 year old male who presents with shoulder pain following a fall. He is accompanied by his daughter.  He has experienced shoulder pain since a fall on Jan 20, 2024, primarily located in the shoulder and occasionally extending down the arm. The pain is intermittent and affects his ability to drive, as he uses his right hand to avoid pain when lifting his left arm. There is no neck pain or pain radiating to his fingers.  He has a previous shoulder injury from playing ice hockey as a teenager, involving a dislocation that was manually reduced without medical attention.  He uses a hot pack for pain relief, which provides some relief. A course of steroid medication was completed following the fall, which helped with general aches but not specifically the shoulder pain.  He lives in [place] and experiences limitations in shoulder movement, particularly when lifting his arm above shoulder level. He is currently taking Eliquis .  Physical Exam MUSCULOSKELETAL: Shoulder ROM grossly normal.  Weakness to infraspinatus and subscap to MMT.  Pain with impingement testing.  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 Weakness in infraspinatus and subscapularis due to degenerative changes. Surgery not advised due to age. NSAIDs contraindicated. - will refer to Dr. Burnetta for intraarticular injection - Refer to physical therapy for shoulder rehabilitation. - Advise acetaminophen  for pain  management. - Recommend heat or cold therapy based on symptom relief preference.  Follow-Up Instructions: No follow-ups on file.   Orders:  Orders Placed This Encounter  Procedures   XR Shoulder Left   Ambulatory referral to Physical Therapy   AMB referral to sports medicine   No orders of the defined types were placed in this encounter.     Procedures: No procedures performed   Clinical Data: No additional findings.   Subjective: Chief Complaint  Patient presents with   Left Shoulder - Pain    HPI  Review of Systems  Constitutional: Negative.   HENT: Negative.    Eyes: Negative.   Respiratory: Negative.    Cardiovascular: Negative.   Gastrointestinal: Negative.   Endocrine: Negative.   Genitourinary: Negative.   Skin: Negative.   Allergic/Immunologic: Negative.   Neurological: Negative.   Hematological: Negative.   Psychiatric/Behavioral: Negative.    All other systems reviewed and are negative.    Objective: Vital Signs: There were no vitals taken for this visit.  Physical Exam Vitals and nursing note reviewed.  Constitutional:      Appearance: He is well-developed.  HENT:     Head: Normocephalic and atraumatic.  Eyes:     Pupils: Pupils are equal, round, and reactive to light.  Pulmonary:     Effort: Pulmonary effort is normal.  Abdominal:     Palpations: Abdomen is soft.  Musculoskeletal:        General: Normal range of motion.     Cervical back:  Neck supple.  Skin:    General: Skin is warm.  Neurological:     Mental Status: He is alert and oriented to person, place, and time.  Psychiatric:        Behavior: Behavior normal.        Thought Content: Thought content normal.        Judgment: Judgment normal.     Ortho Exam  Specialty Comments:  No specialty comments available.  Imaging: XR Shoulder Left Result Date: 02/25/2024 X-rays of the left shoulder show degenerative changes of the glenohumeral and acromioclavicular joints.  No  acute abnormalities.    PMFS History: Patient Active Problem List   Diagnosis Date Noted   Snoring 09/21/2023   Elevated blood pressure reading in office with white coat syndrome, without diagnosis of hypertension 04/11/2022   Paroxysmal atrial fibrillation (HCC) 04/09/2022   Precordial pain 12/11/2021   Irregular heart beat 12/11/2021   BPH with urinary obstruction 06/27/2016   RECTAL BLEEDING 06/24/2010   History of colonic polyps 06/24/2010   Past Medical History:  Diagnosis Date   Arthritis    BPH (benign prostatic hyperplasia)    Diverticulosis of colon    Foley catheter in place    Hematuria    History of adenomatous polyp of colon    2011-- adenomatous;   09-12-2015  tubular adenoma   Urinary retention    Wears glasses     Family History  Problem Relation Age of Onset   Colon cancer Father 33    Past Surgical History:  Procedure Laterality Date   CARDIOVERSION N/A 11/05/2023   Procedure: CARDIOVERSION;  Surgeon: Mona Vinie BROCKS, MD;  Location: [facility];  Service: Cardiovascular;  Laterality: N/A;   COLONOSCOPY WITH PROPOFOL   last one 09-12-2015   SHOULDER ARTHROSCOPY W/ SUBACROMIAL DECOMPRESSION AND DISTAL CLAVICLE EXCISION Left 11/05/2006   SHOULDER ARTHROSCOPY WITH SUBACROMIAL DECOMPRESSION Right 06/16/2002   TONSILLECTOMY AND ADENOIDECTOMY  child   TRANSESOPHAGEAL ECHOCARDIOGRAM (CATH LAB) N/A 11/05/2023   Procedure: TRANSESOPHAGEAL ECHOCARDIOGRAM;  Surgeon: Mona Vinie BROCKS, MD;  Location: [facility];  Service: Cardiovascular;  Laterality: N/A;   TRANSURETHRAL RESECTION OF PROSTATE N/A 06/27/2016   Procedure: TRANSURETHRAL RESECTION OF THE PROSTATE (TURP) WITH GYRUS: RMOVAL BLADDER STONE;  Surgeon: Mark Ottelin, MD;  Location: [facility] [place];  Service: Urology;  Laterality: N/A;   Social History   Occupational History   Not on file  Tobacco Use   Smoking status: Never   Smokeless tobacco: Never  Vaping Use   Vaping status:  Never Used  Substance and Sexual Activity   Alcohol  use: Yes    Alcohol /week: 1.0 standard drink of alcohol     Types: 1 Shots of liquor per week    Comment: occasionally   Drug use: No   Sexual activity: Not on file

## 2024-03-17 ENCOUNTER — Ambulatory Visit: Admitting: Rehabilitative and Restorative Service Providers"

## 2024-03-17 ENCOUNTER — Encounter: Payer: Self-pay | Admitting: Rehabilitative and Restorative Service Providers"

## 2024-03-17 DIAGNOSIS — M25612 Stiffness of left shoulder, not elsewhere classified: Secondary | ICD-10-CM

## 2024-03-17 DIAGNOSIS — M6281 Muscle weakness (generalized): Secondary | ICD-10-CM

## 2024-03-17 DIAGNOSIS — M25512 Pain in left shoulder: Secondary | ICD-10-CM

## 2024-03-17 DIAGNOSIS — R293 Abnormal posture: Secondary | ICD-10-CM

## 2024-03-17 NOTE — Therapy (Signed)
 OUTPATIENT PHYSICAL THERAPY SHOULDER EVALUATION   Patient Name: Cody Hudson MRN: [id number] DOB:08-Jun-1934, 89 y.o., male Today's Date: 03/17/2024  END OF SESSION:  PT End of Session - 03/17/24 0937     Visit Number 1    Number of Visits 12    Date for PT Re-Evaluation 05/12/24    Authorization Type [employer] MEDICARE    Authorization - Number of Visits 12    Progress Note Due on Visit 10    PT Start Time 0844    PT Stop Time 0932    PT Time Calculation (min) 48 min    Activity Tolerance Patient tolerated treatment well;No increased pain;Patient limited by pain    Behavior During Therapy [facility] for tasks assessed/performed          Past Medical History:  Diagnosis Date   Arthritis    BPH (benign prostatic hyperplasia)    Diverticulosis of colon    Foley catheter in place    Hematuria    History of adenomatous polyp of colon    2011-- adenomatous;   09-12-2015  tubular adenoma   Urinary retention    Wears glasses    Past Surgical History:  Procedure Laterality Date   CARDIOVERSION N/A 11/05/2023   Procedure: CARDIOVERSION;  Surgeon: Mona Vinie BROCKS, MD;  Location: [facility];  Service: Cardiovascular;  Laterality: N/A;   COLONOSCOPY WITH PROPOFOL   last one 09-12-2015   SHOULDER ARTHROSCOPY W/ SUBACROMIAL DECOMPRESSION AND DISTAL CLAVICLE EXCISION Left 11/05/2006   SHOULDER ARTHROSCOPY WITH SUBACROMIAL DECOMPRESSION Right 06/16/2002   TONSILLECTOMY AND ADENOIDECTOMY  child   TRANSESOPHAGEAL ECHOCARDIOGRAM (CATH LAB) N/A 11/05/2023   Procedure: TRANSESOPHAGEAL ECHOCARDIOGRAM;  Surgeon: Mona Vinie BROCKS, MD;  Location: [facility];  Service: Cardiovascular;  Laterality: N/A;   TRANSURETHRAL RESECTION OF PROSTATE N/A 06/27/2016   Procedure: TRANSURETHRAL RESECTION OF THE PROSTATE (TURP) WITH GYRUS: RMOVAL BLADDER STONE;  Surgeon: Mark Ottelin, MD;  Location: [facility];  Service: Urology;  Laterality: N/A;   Patient Active Problem List    Diagnosis Date Noted   Snoring 09/21/2023   Elevated blood pressure reading in office with white coat syndrome, without diagnosis of hypertension 04/11/2022   Paroxysmal atrial fibrillation (HCC) 04/09/2022   Precordial pain 12/11/2021   Irregular heart beat 12/11/2021   BPH with urinary obstruction 06/27/2016   RECTAL BLEEDING 06/24/2010   History of colonic polyps 06/24/2010    PCP: Charlie Love, MD  REFERRING PROVIDER: Kay CHRISTELLA Cummins, MD  REFERRING DIAG: [phone] (ICD-10-CM) - Left shoulder pain, unspecified chronicity  THERAPY DIAG:  Abnormal posture - Plan: PT plan of care cert/re-cert  Acute pain of left shoulder - Plan: PT plan of care cert/re-cert  Muscle weakness (generalized) - Plan: PT plan of care cert/re-cert  Stiffness of left shoulder, not elsewhere classified - Plan: PT plan of care cert/re-cert  Rationale for Evaluation and Treatment: Rehabilitation  ONSET DATE: Cody Hudson, estimates late May or sometime in June  SUBJECTIVE:  SUBJECTIVE STATEMENT: Cody Hudson took a spill down a muddy hill in late May or June of this year.  His left shoulder has been flared-up since that time.  He got a cortisone shot 02/25/2024.  He got immediate relief, but relief did not last long.  Hand dominance: Right  PERTINENT HISTORY: OA, Rt shoulder scope '03, Lt shoulder scope '08  PAIN:  Are you having pain? Yes: NPRS scale: 3-6/10 Pain location: Lt shoulder Pain description: Ache, sore Aggravating factors: At night, driving Relieving factors: Over the counter pain meds  PRECAUTIONS: None  RED FLAGS: None   WEIGHT BEARING RESTRICTIONS: No  FALLS:  Has patient fallen in last 6 months? Yes. Number of falls 1, reason for the current episode of physical therapy  LIVING ENVIRONMENT: Lives with: lives with  their family and lives with their spouse Lives in: House/apartment Stairs: No problems Has following equipment at home: None  OCCUPATION: Substitute teach, mostly retired  PLOF: Independent  PATIENT GOALS: Decrease pain with all activities  NEXT MD VISIT:   OBJECTIVE:  Note: Objective measures were completed at Evaluation unless otherwise noted.  DIAGNOSTIC FINDINGS:  X-rays of the left shoulder show degenerative changes of the glenohumeral  and acromioclavicular joints.  No acute abnormalities.  PATIENT SURVEYS:  PSFS: THE PATIENT SPECIFIC FUNCTIONAL SCALE  Place score of 0-10 (0 = unable to perform activity and 10 = able to perform activity at the same level as before injury or problem)  Activity Date: 03/17/2024    Sleeping 7    2.  Reaching with the elbow up 7    3.     4.      Total Score 7      Total Score = Sum of activity scores/number of activities  Minimally Detectable Change: 3 points (for single activity); 2 points (for average score)  [facility] (nd). The Patient Specific Functional Scale . Retrieved from [email or web address]   COGNITION: Overall cognitive status: Within functional limits for tasks assessed     SENSATION: Cody Hudson notes no peripheral pain or paresthesias of left upper extremity  POSTURE: Significant for forward head, internally rotated and protracted shoulders  UPPER EXTREMITY ROM:   Passive ROM Left/Right 03/17/2024   Shoulder flexion 130/150   Shoulder extension    Shoulder abduction    Shoulder horizontal adduction 10/10   Shoulder internal rotation 60/50   Shoulder external rotation 50/55   Elbow flexion    Elbow extension    Wrist flexion    Wrist extension    Wrist ulnar deviation    Wrist radial deviation    Wrist pronation    Wrist supination    (Blank rows = not tested)  UPPER EXTREMITY STRENGTH:  In pounds assessed with hand-held dynamometer  Left/Right 03/17/2024   Shoulder flexion    Shoulder extension    Shoulder abduction    Shoulder adduction    Shoulder internal rotation 29.1/50.6   Shoulder external rotation 26.2/21.4   Middle trapezius    Lower trapezius    Elbow flexion    Elbow extension    Wrist flexion    Wrist extension    Wrist ulnar deviation    Wrist radial deviation    Wrist pronation    Wrist supination    Grip strength (lbs)    (Blank rows = not tested)  TREATMENT DATE: 03/17/2024 Supine arm raises/scapular protraction 20 x 3 seconds with 5 pounds Supine external rotation stretch with elbow abducted 70 degrees and elbow above shoulder height on pillows 10 x 10 seconds Standing scapular retraction/shoulder blade pinches 10 x 5 seconds Standing posterior capsule stretch 5 x 10 seconds  02464: Reviewed imaging; reviewed shoulder anatomy with shoulder model; reviewed examination findings and day 1 home exercises   PATIENT EDUCATION: Education details: See above Person educated: Patient Education method: Explanation, Demonstration, Tactile cues, Verbal cues, and Handouts Education comprehension: verbalized understanding, returned demonstration, verbal cues required, tactile cues required, and needs further education  HOME EXERCISE PROGRAM: Access Code: [email or web address] URL: [email or web address] Date: 03/17/2024 Prepared by: Lamar Ivory  Exercises - Supine Scapular Protraction in Flexion with Dumbbells  - 2-3 x daily - 7 x weekly - 1 sets - 20 reps - 3 seconds hold - Supine Shoulder External Rotation Stretch  - 2-3 x daily - 7 x weekly - 1 sets - 10-20 reps - 10 seconds hold - Standing Shoulder Posterior Capsule Stretch  - 2-3 x daily - 7 x weekly - 1 sets - 10 reps - 10 seconds hold - Standing Scapular Retraction  - 5 x daily - 7 x weekly - 1 sets - 5 reps - 5 second  hold  ASSESSMENT:  CLINICAL IMPRESSION: Patient is a 89 y.o. male who was seen today for physical therapy evaluation and treatment for M25.512 (ICD-10-CM) - Left shoulder pain, unspecified chronicity.  Cody Hudson slipped and rolled down a muddy hill in late May or early June of this year.  He has had left shoulder pain since that time.  He got some short-term relief with a cortisone shot, but things have not improved since that time.  Assessment today shows active range of motion, capsular flexibility, scapular and rotator cuff strength impairments that will benefit from supervised physical therapy.  His prognosis towards the goals is good with the recommended plan of care.  OBJECTIVE IMPAIRMENTS: decreased activity tolerance, decreased endurance, decreased knowledge of condition, decreased ROM, decreased strength, decreased safety awareness, increased edema, impaired flexibility, impaired UE functional use, postural dysfunction, and pain.   ACTIVITY LIMITATIONS: carrying, lifting, sleeping, and reach over head  PARTICIPATION LIMITATIONS: cleaning, driving, and community activity  PERSONAL FACTORS: OA, Rt shoulder scope '03, Lt shoulder scope '08 are also affecting patient's functional outcome.   REHAB POTENTIAL: Good  CLINICAL DECISION MAKING: Stable/uncomplicated  EVALUATION COMPLEXITY: Low   GOALS: Goals reviewed with patient? Yes  SHORT TERM GOALS: Target date: 04/14/2024  Charlena will be independent with his daily home exercise program Baseline: Started 03/17/2024 Goal status: INITIAL  2.  Improve shoulder active range of motion for flexion to 150/150; horizontal adduction 25/25; internal rotation is 60/60 and external rotation 70/70 Baseline: 130/150; 10/10; 60/50; 50/55 degrees respectively  Goal status: INITIAL  3.  Improve shoulder strength for ER to 27 pounds and IR to 32 pounds Baseline: 26.2 and 29.1 pounds, respectively Goal status: INITIAL   LONG TERM GOALS: Target date:  05/12/2024  Improve patient specific functional score to at least 9 Baseline: 7 Goal status: INITIAL  2.  Charlena will report left shoulder pain consistently 0-2/10 on numeric pain rating scale Baseline: 3-6/10 Goal status: INITIAL  3.  Improve bilateral shoulder flexion to 170 degrees; horizontal adduction 40 degrees and external rotation at 90 degrees Baseline: 130/150; 10/10 and 50/55 respectively Goal status: INITIAL  4.  Charlena will be independent with his long-term maintenance home exercise program at  discharge Baseline: Started 03/17/2024 Goal status: INITIAL   PLAN:  PT FREQUENCY: 1-2x/week  PT DURATION: 8 weeks  PLANNED INTERVENTIONS: 97750- Physical Performance Testing, 97110-Therapeutic exercises, 97530- Therapeutic activity, 97112- Neuromuscular re-education, 97535- Self Care, 02859- Manual therapy, 97016- Vasopneumatic device, [id number] (1-2 muscles), 20561 (3+ muscles)- Dry Needling, Patient/Family education, Joint mobilization, and Cryotherapy  PLAN FOR NEXT SESSION: Review day 1 home exercise program.  Okay for appropriate scapular and rotator cuff strength progressions, although capsular flexibility should be the focus based on objective assessment at evaluation.   Myer LELON Ivory, PT, MPT 03/17/2024, 5:14 PM

## 2024-03-21 ENCOUNTER — Telehealth: Payer: Self-pay | Admitting: Cardiology

## 2024-03-21 NOTE — Telephone Encounter (Signed)
 Informed Cody Hudson (dpr) that the patient should not be taking Advil or any NSAIDS along with Eliquis .   Tylenol  is fine or lidocaine  patches or icyhot. She verbalized understanding.

## 2024-03-21 NOTE — Telephone Encounter (Signed)
 Pt c/o medication issue:  1. Name of Medication:   apixaban  (ELIQUIS ) 5 MG TABS tablet    2. How are you currently taking this medication (dosage and times per day)?   Take 1 tablet (5 mg total) by mouth 2 (two) times daily.    3. Are you having a reaction (difficulty breathing--STAT)? No  4. What is your medication issue? Patient's daughter stated the patient has been taking Advil and is concerned if he can take Advil while taking Eliquis . Please advise.

## 2024-03-21 NOTE — Telephone Encounter (Signed)
 Flexaril or gabapentin would not likely be helpful given his pain is not nerve or muscle spasm related. His provider is trying to set him up with injection. He recommended ice/heat and APAP

## 2024-03-31 ENCOUNTER — Encounter: Payer: Self-pay | Admitting: Cardiology

## 2024-03-31 ENCOUNTER — Other Ambulatory Visit (HOSPITAL_COMMUNITY): Payer: Self-pay

## 2024-03-31 ENCOUNTER — Ambulatory Visit: Attending: Cardiology | Admitting: Cardiology

## 2024-03-31 ENCOUNTER — Ambulatory Visit (INDEPENDENT_AMBULATORY_CARE_PROVIDER_SITE_OTHER)

## 2024-03-31 VITALS — BP 104/58 | HR 44 | Ht 75.0 in | Wt 198.0 lb

## 2024-03-31 DIAGNOSIS — I480 Paroxysmal atrial fibrillation: Secondary | ICD-10-CM | POA: Diagnosis not present

## 2024-03-31 MED ORDER — APIXABAN 5 MG PO TABS
5.0000 mg | ORAL_TABLET | Freq: Two times a day (BID) | ORAL | 3 refills | Status: DC
  Filled 2024-03-31: qty 60, 30d supply, fill #0

## 2024-03-31 NOTE — Progress Notes (Unsigned)
 Applied a 14 day Zio XT monitor to patient in the office ?

## 2024-03-31 NOTE — Patient Instructions (Signed)
 Medication Instructions:  Refill Eliquis  and patient assistance started   *If you need a refill on your cardiac medications before your next appointment, please call your pharmacy*  Testing/Procedures: 2 week zio   Your physician has requested that you wear a Zio heart monitor for __14___ days. This will be mailed to your home with instructions on how to apply the monitor and how to return it when finished. Please allow 2 weeks after returning the heart monitor before our office calls you with the results.   Follow-Up: At [facility], you and your health needs are our priority.  As part of our continuing mission to provide you with exceptional heart care, our providers are all part of one team.  This team includes your primary Cardiologist (physician) and Advanced Practice Providers or APPs (Physician Assistants and Nurse Practitioners) who all work together to provide you with the care you need, when you need it.  Your next appointment:   6 month(s)  Provider:   Newman JINNY Lawrence, MD

## 2024-03-31 NOTE — Progress Notes (Signed)
 Cardiology Office Note:  .   Date:  03/31/2024  ID:  Cody Hudson, DOB [birth year], MRN [id number] PCP: Shepard Ade, MD  [facility] Providers Cardiologist:  Newman Lawrence, MD PCP: Shepard Ade, MD  Chief Complaint  Patient presents with   Paroxysmal a-fib      History of Present Illness: .    Cody Hudson is a 89 y.o. male with paroxysmal Afib, mildly reduced LVEF  Patient underwent successful cardioversion 10/2023.  EF noted to be 30 to 35% in 09/2023 TTE, and 10/2023 TEE, improved to 45 to 50% on TTE in 12/2023 following the cardioversion.  Today's note an appointment was made after patient was seen by his PCP on 03/28/2024 with complaints of tachycardia with heart rate as high as 126 bpm, associated with some lightheadedness.  Patient is here today with his daughter.  Barring occasional activities, he does not have any symptoms.  He has noticed his heart rate as high as 140s.  Today, patient is in sinus rhythm with heart rate in 40s.  Patient walks 10,000 steps daily.  He does not drink much water  intake.  Vitals:   03/31/24 0834  BP: (!) 104/58  Pulse: (!) 44  SpO2: 98%      ROS:  Review of Systems  Constitutional: Negative for malaise/fatigue.  Cardiovascular:  Negative for chest pain, dyspnea on exertion, leg swelling, palpitations and syncope.  Respiratory:  Negative for cough.   Neurological:  Positive for light-headedness.     Studies Reviewed: SABRA        EKG 03/31/2024: Marked sinus bradycardia with marked sinus arrhythmia with 1st degree A-V block Incomplete right bundle branch block When compared with ECG of 26-Nov-2023 09:48, Premature supraventricular complexes are no longer Present  EKG 09/21/2023: Atrial fibrillation 80 bpm Low voltage QRS When compared with ECG of 18-Sep-2023 17:28, No significant change was found    EKG 09/18/2023: A-fib with controlled ventricular rate  Independently interpreted 03/2024: Hb 12.5, Pl  150 Cr 1.2, eGFR 57, K 4.3   1/24//2025: Hb 12.8 Cr 1.22, eGFR 57 Trop HS 8, 8 BNP 253  CT cardiac scoring 12/2021: 1. Total Agatston Score: 33.8 2.  Aortic Atherosclerosis  Echocardiogram 12/2023:  1. Left ventricular ejection fraction, by estimation, is 45 to 50%. Left  ventricular ejection fraction by 3D volume is 52 %. The left ventricle has  mildly decreased function. The left ventricle demonstrates global  hypokinesis. The left ventricular  internal cavity size was mildly dilated. Left ventricular diastolic  parameters are consistent with Grade I diastolic dysfunction (impaired  relaxation).   2. Right ventricular systolic function is normal. The right ventricular  size is normal. There is normal pulmonary artery systolic pressure. The  estimated right ventricular systolic pressure is 29.4 mmHg.   3. Left atrial size was mildly dilated.   4. Right atrial size was mild to moderately dilated.   5. The mitral valve is normal in structure. Mild mitral valve  regurgitation. No evidence of mitral stenosis.   6. The aortic valve is tricuspid. Aortic valve regurgitation is not  visualized. No aortic stenosis is present.   7. The inferior vena cava is normal in size with greater than 50%  respiratory variability, suggesting right atrial pressure of 3 mmHg.   TEE 10/2023:  1. Left ventricular ejection fraction, by estimation, is 35 to 40%. The  left ventricle has moderately decreased function. The left ventricle  demonstrates global hypokinesis. The left ventricular internal cavity size  was mildly dilated. There is mild  left ventricular hypertrophy.   2. Right ventricular systolic function was not well visualized. The right  ventricular size is not well visualized.   3. Left atrial size was severely dilated. No left atrial/left atrial  appendage thrombus was detected.   4. Right atrial size was severely dilated.   5. The mitral valve is grossly normal. Mild to moderate mitral  valve  regurgitation.   6. The tricuspid valve is abnormal.   7. The aortic valve is tricuspid. Aortic valve regurgitation is not  visualized.   8. There is mild (Grade II) layered plaque involving the descending  aorta.   Conclusion(s)/Recommendation(s): No LA/LAA thrombus identified. Successful  cardioversion performed with restoration of normal sinus rhythm.   Exercise nuclear stress test 11/2021: Normal ECG stress. The patient exercised for 4 minutes and 58 seconds of a Bruce protocol, achieving approximately 6.99 METs. The blood pressure response was normal. Myocardial perfusion is normal. Overall LV systolic function is normal without regional wall motion abnormalities. Stress LV EF: 51%.  No previous exam available for comparison. Low risk.     Risk Assessment/Calculations:    CHA2DS2-VASc Score = 2  This indicates a 2.2% annual risk of stroke. The patient's score is based upon: CHF History: 0 HTN History: 0 Diabetes History: 0 Stroke History: 0 Vascular Disease History: 0 Age Score: 2 Gender Score: 0       Physical Exam:   Physical Exam Vitals and nursing note reviewed.  Constitutional:      General: He is not in acute distress. Neck:     Vascular: No JVD.  Cardiovascular:     Rate and Rhythm: Regular rhythm. Bradycardia present. Occasional Extrasystoles are present.    Heart sounds: Normal heart sounds. No murmur heard. Pulmonary:     Effort: Pulmonary effort is normal.     Breath sounds: Normal breath sounds. No wheezing or rales.  Musculoskeletal:     Right lower leg: No edema.     Left lower leg: No edema.      VISIT DIAGNOSES:   ICD-10-CM   1. Paroxysmal atrial fibrillation (HCC)  I48.0 EKG 12-Lead        ASSESSMENT AND PLAN: .    Cody Hudson is a 89 y.o. male with paroxysmal A-fib, mildly reduced LVEF  Paroxysmal A-fib: Episodes of heart rate in 120s to 140s are likely his A-fib episodes.  At baseline, he is in sinus bradycardia with  rate in 40s, likely a function of his excellent conditioning. Recommend to be, noted to assess A-fib burden and average heart rate. Unless high A-fib burden, or worsening symptoms, I doubt he will need any rhythm control therapy. If he does have high A-fib burden, we may need to consider rhythm control therapy then, as his EF has been as low as 30-35% while in persistent A-fib. Encourage liberal hydration at least 2-3 L of water  every day. Continue Eliquis  5 mg twice daily, will work on patient assistance.   Meds ordered this encounter  Medications   apixaban  (ELIQUIS ) 5 MG TABS tablet    Sig: Take 1 tablet (5 mg total) by mouth 2 (two) times daily.    Dispense:  180 tablet    Refill:  3    Pt needs coupon     F/u in 6 months  Signed, Newman JINNY Lawrence, MD

## 2024-04-05 ENCOUNTER — Encounter: Admitting: Rehabilitative and Restorative Service Providers"

## 2024-04-07 ENCOUNTER — Encounter: Admitting: Rehabilitative and Restorative Service Providers"

## 2024-04-23 ENCOUNTER — Encounter: Payer: Self-pay | Admitting: Cardiology

## 2024-04-26 NOTE — Telephone Encounter (Signed)
Can you help me with this?

## 2024-04-29 ENCOUNTER — Ambulatory Visit: Payer: Self-pay | Admitting: Cardiology

## 2024-04-29 DIAGNOSIS — I480 Paroxysmal atrial fibrillation: Secondary | ICD-10-CM | POA: Diagnosis not present

## 2024-04-29 NOTE — Progress Notes (Signed)
 Several episodes of rapid heartbeat from the top chamber of the heart, but all episodes are short lasting.  A-fib burden is not high.  Heart rate is well-controlled.  If no recurrent episodes of very rapid heartbeat, continue current medications.  If multiple episodes of rapid heartbeat experienced by patient, we will discuss addition of another medication, such as amiodarone.    Thanks MJP

## 2024-05-26 ENCOUNTER — Other Ambulatory Visit: Payer: Self-pay | Admitting: Cardiology

## 2024-05-26 NOTE — Telephone Encounter (Signed)
 Prescription refill request for Eliquis  received. Indication:afib Last office visit:8/25 Scr:1.20  3/25 Age: 89 Weight:89.8  kg  Prescription refilled

## 2024-06-22 ENCOUNTER — Other Ambulatory Visit: Payer: Self-pay | Admitting: Internal Medicine

## 2024-06-22 DIAGNOSIS — R413 Other amnesia: Secondary | ICD-10-CM

## 2024-06-27 ENCOUNTER — Encounter: Payer: Self-pay | Admitting: Radiology

## 2024-07-06 ENCOUNTER — Encounter: Payer: Self-pay | Admitting: Internal Medicine

## 2024-07-19 ENCOUNTER — Ambulatory Visit
Admission: RE | Admit: 2024-07-19 | Discharge: 2024-07-19 | Disposition: A | Discharge status: Home / Self Care | Source: Ambulatory Visit | Attending: Internal Medicine | Admitting: Internal Medicine

## 2024-07-19 DIAGNOSIS — R413 Other amnesia: Secondary | ICD-10-CM

## 2024-10-06 ENCOUNTER — Ambulatory Visit: Admitting: Podiatry

## 2024-12-01 ENCOUNTER — Ambulatory Visit: Admitting: Neurology
